# Patient Record
Sex: Male | Born: 1955 | Race: White | Hispanic: No | Marital: Single | Smoking: Former smoker
Health system: Southern US, Community
[De-identification: ages and names within clinical notes are randomized; demographics above are authoritative.]

## PROBLEM LIST (undated history)

## (undated) DIAGNOSIS — M109 Gout, unspecified: Secondary | ICD-10-CM

## (undated) DIAGNOSIS — I10 Essential (primary) hypertension: Secondary | ICD-10-CM

## (undated) DIAGNOSIS — E669 Obesity, unspecified: Secondary | ICD-10-CM

## (undated) DIAGNOSIS — Z8619 Personal history of other infectious and parasitic diseases: Secondary | ICD-10-CM

## (undated) DIAGNOSIS — E785 Hyperlipidemia, unspecified: Secondary | ICD-10-CM

## (undated) DIAGNOSIS — K649 Unspecified hemorrhoids: Secondary | ICD-10-CM

## (undated) HISTORY — DX: Gout, unspecified: M10.9

## (undated) HISTORY — DX: Obesity, unspecified: E66.9

## (undated) HISTORY — DX: Hyperlipidemia, unspecified: E78.5

## (undated) HISTORY — DX: Personal history of other infectious and parasitic diseases: Z86.19

## (undated) HISTORY — DX: Essential (primary) hypertension: I10

## (undated) HISTORY — DX: Unspecified hemorrhoids: K64.9

---
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
## 2011-04-22 HISTORY — PX: UMBILICAL HERNIA REPAIR: SHX196

## 2012-04-09 ENCOUNTER — Ambulatory Visit: Payer: Self-pay | Admitting: Surgery

## 2013-03-31 ENCOUNTER — Encounter (INDEPENDENT_AMBULATORY_CARE_PROVIDER_SITE_OTHER): Payer: Self-pay

## 2013-03-31 ENCOUNTER — Ambulatory Visit (INDEPENDENT_AMBULATORY_CARE_PROVIDER_SITE_OTHER): Payer: 59 | Admitting: Family Medicine

## 2013-03-31 ENCOUNTER — Encounter: Payer: Self-pay | Admitting: Family Medicine

## 2013-03-31 VITALS — BP 124/82 | HR 76 | Temp 97.9°F | Ht 68.0 in | Wt 217.5 lb

## 2013-03-31 DIAGNOSIS — Z0001 Encounter for general adult medical examination with abnormal findings: Secondary | ICD-10-CM | POA: Insufficient documentation

## 2013-03-31 DIAGNOSIS — Z1211 Encounter for screening for malignant neoplasm of colon: Secondary | ICD-10-CM

## 2013-03-31 DIAGNOSIS — Z0000 Encounter for general adult medical examination without abnormal findings: Secondary | ICD-10-CM | POA: Insufficient documentation

## 2013-03-31 DIAGNOSIS — Z125 Encounter for screening for malignant neoplasm of prostate: Secondary | ICD-10-CM

## 2013-03-31 LAB — PSA: PSA: 0.91 ng/mL (ref 0.10–4.00)

## 2013-03-31 LAB — BASIC METABOLIC PANEL
BUN: 14 mg/dL (ref 6–23)
CO2: 29 mEq/L (ref 19–32)
Creatinine, Ser: 1 mg/dL (ref 0.4–1.5)
GFR: 78.81 mL/min (ref >60.00)
Glucose, Bld: 92 mg/dL (ref 70–99)
Potassium: 5.4 mEq/L — ABNORMAL HIGH (ref 3.5–5.1)
Sodium: 140 mEq/L (ref 135–145)

## 2013-03-31 LAB — LIPID PANEL
Cholesterol: 216 mg/dL — ABNORMAL HIGH (ref 0–200)
HDL: 40.6 mg/dL (ref >39.00)
VLDL: 30 mg/dL (ref 0.0–40.0)

## 2013-03-31 LAB — LDL CHOLESTEROL, DIRECT: Direct LDL: 150.6 mg/dL

## 2013-03-31 NOTE — Patient Instructions (Signed)
Get flu shot at local pharmacy. Pass by Marion's office to schedule screening colonoscopy Blood work today. Good to meet you today, call us with questions. Double check on Tdap shot.

## 2013-03-31 NOTE — Assessment & Plan Note (Signed)
Preventative protocols reviewed and updated unless pt declined. Discussed healthy diet and lifestyle.  

## 2013-03-31 NOTE — Progress Notes (Signed)
Subjective:    Patient ID: Randy Decker, male    DOB: 1955-11-01, 57 y.o.   MRN: [id number]  HPI CC: new pt to establish, CPE  Presents for insurance physical. Does notice some soreness in legs in the morning.  Lives alone, 2 cats and 1 dog Occupation: maintenance Psychologist, clinical) Edu: Automotive engineer Activity: active at work Diet: some water, fruits/vegetables daily, rare meat  Preventative: Last CPE 2012 Colon cancer screening - discussed, would like  have colonsocopy done Prostate cancer screening - discussed, would like to screen. Flu shot - today Tetanus - 2012 Hep B series 2012  Seat belt use discussed. Sunscreen use discussed.  No sunburns in last year. No changing spots on skin.  Medications and allergies reviewed and updated in chart.  Past histories reviewed and updated if relevant as below. There are no active problems to display for this patient.  Past Medical History  Diagnosis Date  . History of chicken pox   . H/O measles     possible--can't remember   Past Surgical History  Procedure Laterality Date  . Tonsillectomy and adenoidectomy  1964  . Umbilical hernia repair  2013   History  Substance Use Topics  . Smoking status: Former Smoker -- 1.00 packs/day for 5 years    Types: Cigarettes    Start date: 04/21/1978    Quit date: 04/22/1983  . Smokeless tobacco: Never Used  . Alcohol Use: Yes     Comment: Occasional   Family History  Problem Relation Age of Onset  . Hypertension Father   . Aortic aneurysm Father 22    deceased  . Diabetes Neg Hx   . Cancer Neg Hx   . Stroke Neg Hx    No Known Allergies No current outpatient prescriptions on file prior to visit.   No current facility-administered medications on file prior to visit.     Review of Systems  Constitutional: Negative for fever, chills, activity change, appetite change, fatigue and unexpected weight change.  HENT: Negative for hearing loss.   Eyes: Negative for visual  disturbance.  Respiratory: Negative for cough, chest tightness, shortness of breath and wheezing.   Cardiovascular: Negative for chest pain, palpitations and leg swelling.  Gastrointestinal: Negative for nausea, vomiting, abdominal pain, diarrhea, constipation, blood in stool and abdominal distention.  Genitourinary: Negative for hematuria and difficulty urinating.  Musculoskeletal: Negative for arthralgias, myalgias and neck pain.  Skin: Negative for rash.  Neurological: Negative for dizziness, seizures, syncope and headaches.  Hematological: Negative for adenopathy. Does not bruise/bleed easily.  Psychiatric/Behavioral: Negative for dysphoric mood. The patient is not nervous/anxious.        Objective:   Physical Exam  Nursing note and vitals reviewed. Constitutional: He is oriented to person, place, and time. He appears well-developed and well-nourished. No distress.  HENT:  Head: Normocephalic and atraumatic.  Mouth/Throat: Oropharynx is clear and moist. No oropharyngeal exudate.  Eyes: Conjunctivae and EOM are normal. Pupils are equal, round, and reactive to light. No scleral icterus.  Neck: Normal range of motion. Neck supple. No thyromegaly present.  Cardiovascular: Normal rate, regular rhythm, normal heart sounds and intact distal pulses.   No murmur heard. Pulses:      Radial pulses are 2+ on the right side, and 2+ on the left side.  Pulmonary/Chest: Effort normal and breath sounds normal. No respiratory distress. He has no wheezes. He has no rales.  Abdominal: Soft. Bowel sounds are normal. He exhibits no distension and no mass. There is no  tenderness. There is no rebound and no guarding.  No abd /renal bruits  Genitourinary: Prostate normal. Rectal exam shows external hemorrhoid (noninflamed). Rectal exam shows no internal hemorrhoid, no fissure, no mass, no tenderness and anal tone normal. Prostate is not enlarged (15gm) and not tender.  Musculoskeletal: Normal range of  motion. He exhibits no edema.  Lymphadenopathy:    He has no cervical adenopathy.  Neurological: He is alert and oriented to person, place, and time.  CN grossly intact, station and gait intact  Skin: Skin is warm and dry. No rash noted.  Psychiatric: He has a normal mood and affect. His behavior is normal. Judgment and thought content normal.       Assessment & Plan:

## 2013-03-31 NOTE — Progress Notes (Signed)
Pre-visit discussion using our clinic review tool. No additional management support is needed unless otherwise documented below in the visit note.  

## 2013-04-02 ENCOUNTER — Other Ambulatory Visit: Payer: Self-pay | Admitting: Family Medicine

## 2013-04-02 DIAGNOSIS — E875 Hyperkalemia: Secondary | ICD-10-CM

## 2013-04-07 ENCOUNTER — Encounter: Payer: Self-pay | Admitting: Internal Medicine

## 2013-04-21 HISTORY — PX: COLONOSCOPY: SHX174

## 2013-06-03 ENCOUNTER — Ambulatory Visit (AMBULATORY_SURGERY_CENTER): Payer: 59

## 2013-06-03 VITALS — Ht 68.0 in | Wt 200.0 lb

## 2013-06-03 DIAGNOSIS — Z1211 Encounter for screening for malignant neoplasm of colon: Secondary | ICD-10-CM

## 2013-06-03 MED ORDER — SUPREP BOWEL PREP KIT 17.5-3.13-1.6 GM/177ML PO SOLN: 1.0000 | Freq: Once | ORAL | Status: DC

## 2013-06-07 ENCOUNTER — Encounter: Payer: Self-pay | Admitting: Internal Medicine

## 2013-06-17 ENCOUNTER — Encounter: Payer: 59 | Admitting: Internal Medicine

## 2013-08-09 ENCOUNTER — Encounter: Payer: Self-pay | Admitting: Internal Medicine

## 2013-08-09 ENCOUNTER — Ambulatory Visit (AMBULATORY_SURGERY_CENTER): Payer: 59 | Admitting: Internal Medicine

## 2013-08-09 VITALS — BP 117/88 | HR 55 | Temp 98.7°F | Resp 15 | Ht 68.0 in | Wt 200.0 lb

## 2013-08-09 DIAGNOSIS — Z1211 Encounter for screening for malignant neoplasm of colon: Secondary | ICD-10-CM

## 2013-08-09 MED ORDER — SODIUM CHLORIDE 0.9 % IV SOLN: 500.0000 mL | INTRAVENOUS | Status: DC

## 2013-08-09 NOTE — Patient Instructions (Addendum)
Your colonoscopy was normal - no polyps or cancer seen.  Next routine colonoscopy in 10 years - 2025  I appreciate the opportunity to care for you. Gatha Mayer, MD, FACG   YOU HAD AN ENDOSCOPIC PROCEDURE TODAY AT [facility]: Refer to the procedure report that was given to you for any specific questions about what was found during the examination.  If the procedure report does not answer your questions, please call your gastroenterologist to clarify.  If you requested that your care partner not be given the details of your procedure findings, then the procedure report has been included in a sealed envelope for you to review at your convenience later.  YOU SHOULD EXPECT: Some feelings of bloating in the abdomen. Passage of more gas than usual.  Walking can help get rid of the air that was put into your GI tract during the procedure and reduce the bloating. If you had a lower endoscopy (such as a colonoscopy or flexible sigmoidoscopy) you may notice spotting of blood in your stool or on the toilet paper. If you underwent a bowel prep for your procedure, then you may not have a normal bowel movement for a few days.  DIET: Your first meal following the procedure should be a light meal and then it is ok to progress to your normal diet.  A half-sandwich or bowl of soup is an example of a good first meal.  Heavy or fried foods are harder to digest and may make you feel nauseous or bloated.  Likewise meals heavy in dairy and vegetables can cause extra gas to form and this can also increase the bloating.  Drink plenty of fluids but you should avoid alcoholic beverages for 24 hours.  ACTIVITY: Your care partner should take you home directly after the procedure.  You should plan to take it easy, moving slowly for the rest of the day.  You can resume normal activity the day after the procedure however you should NOT DRIVE or use heavy machinery for 24 hours (because of the sedation medicines  used during the test).    SYMPTOMS TO REPORT IMMEDIATELY: A gastroenterologist can be reached at any hour.  During normal business hours, 8:30 AM to 5:00 PM Monday through Friday, call [phone].  After hours and on weekends, please call the GI answering service at [phone] who will take a message and have the physician on call contact you.   Following lower endoscopy (colonoscopy or flexible sigmoidoscopy):  Excessive amounts of blood in the stool  Significant tenderness or worsening of abdominal pains  Swelling of the abdomen that is new, acute  Fever of 100F or higher  FOLLOW UP: If any biopsies were taken you will be contacted by phone or by letter within the next 1-3 weeks.  Call your gastroenterologist if you have not heard about the biopsies in 3 weeks.  Our staff will call the home number listed on your records the next business day following your procedure to check on you and address any questions or concerns that you may have at that time regarding the information given to you following your procedure. This is a courtesy call and so if there is no answer at the home number and we have not heard from you through the emergency physician on call, we will assume that you have returned to your regular daily activities without incident.  SIGNATURES/CONFIDENTIALITY: You and/or your care partner have signed paperwork which will be entered into  your electronic medical record.  These signatures attest to the fact that that the information above on your After Visit Summary has been reviewed and is understood.  Full responsibility of the confidentiality of this discharge information lies with you and/or your care-partner.

## 2013-08-09 NOTE — Op Note (Signed)
[facility]  [address] [place], [address]   COLONOSCOPY PROCEDURE REPORT  PATIENT: Randy Decker, Randy Decker  MR#: [id number] BIRTHDATE: 1956-01-24 , 72  yrs. old GENDER: Male ENDOSCOPIST: Gatha Mayer, MD, [facility] REFERRED NU:[id number] Danise Mina, M.D. PROCEDURE DATE:  08/09/2013 PROCEDURE:   Colonoscopy, screening First Screening Colonoscopy - Avg.  risk and is 50 yrs.  old or older Yes.  Prior Negative Screening - Now for repeat screening. N/A  History of Adenoma - Now for follow-up colonoscopy & has been > or = to 3 yrs.  N/A  Polyps Removed Today? No.  Recommend repeat exam, <10 yrs? No. ASA CLASS:   Class II INDICATIONS:average risk screening and first colonoscopy. MEDICATIONS: propofol (Diprivan) 250mg  IV, MAC sedation, administered by CRNA, and These medications were titrated to patient response per physician's verbal order  DESCRIPTION OF PROCEDURE:   After the risks benefits and alternatives of the procedure were thoroughly explained, informed consent was obtained.  A digital rectal exam revealed no abnormalities of the rectum, A digital rectal exam revealed no prostatic nodules, and A digital rectal exam revealed the prostate was not enlarged.   The LB [id number]  endoscope was introduced through the anus and advanced to the cecum, which was identified by both the appendix and ileocecal valve. No adverse events experienced.   The quality of the prep was excellent using Suprep  The instrument was then slowly withdrawn as the colon was fully examined.      COLON FINDINGS: A normal appearing cecum, ileocecal valve, and appendiceal orifice were identified.  The ascending, hepatic flexure, transverse, splenic flexure, descending, sigmoid colon and rectum appeared unremarkable.  No polyps or cancers were seen.   A right colon retroflexion was performed.  Retroflexed views revealed no abnormalities. The time to cecum=3 minutes 42 seconds. Withdrawal time=8  minutes 38 seconds.  The scope was withdrawn and the procedure completed. COMPLICATIONS: There were no complications.  ENDOSCOPIC IMPRESSION: Normal colonoscopy - excellent prep  RECOMMENDATIONS: Repeat Colonscopy in 10 years.   eSigned:  Gatha Mayer, MD, [facility] 08/09/2013 11:03 AM   cc: Ria Bush MD and The Patient

## 2013-08-09 NOTE — Progress Notes (Signed)
Lidocaine-40mg IV prior to Propofol InductionPropofol given over incremental dosages 

## 2013-08-10 ENCOUNTER — Telehealth: Payer: Self-pay

## 2013-08-10 NOTE — Telephone Encounter (Signed)
  Follow up Call-  Call back number 08/09/2013  Post procedure Call Back phone  # [phone]  Permission to leave phone message Yes     Patient questions:  Do you have a fever, pain , or abdominal swelling? no Pain Score  0 *  Have you tolerated food without any problems? yes  Have you been able to return to your normal activities? yes  Do you have any questions about your discharge instructions: Diet   no Medications  no Follow up visit  no  Do you have questions or concerns about your Care? no  Actions: * If pain score is 4 or above: No action needed, pain <4.  No problems per the pt. "Randy Server do a wonderful job up there and I can't thank you enough" per the pt. maw

## 2013-08-12 ENCOUNTER — Encounter: Payer: Self-pay | Admitting: Family Medicine

## 2014-05-22 ENCOUNTER — Telehealth: Payer: Self-pay | Admitting: *Deleted

## 2014-05-22 ENCOUNTER — Ambulatory Visit (INDEPENDENT_AMBULATORY_CARE_PROVIDER_SITE_OTHER): Payer: 59 | Admitting: Family Medicine

## 2014-05-22 ENCOUNTER — Encounter: Payer: Self-pay | Admitting: Family Medicine

## 2014-05-22 VITALS — BP 120/80 | HR 64 | Temp 98.1°F | Wt 222.0 lb

## 2014-05-22 DIAGNOSIS — M79671 Pain in right foot: Secondary | ICD-10-CM

## 2014-05-22 LAB — CBC WITH DIFFERENTIAL/PLATELET
BASOS ABS: 0.1 10*3/uL (ref 0.0–0.1)
Basophils Relative: 0.7 % (ref 0.0–3.0)
Eosinophils Absolute: 0.1 10*3/uL (ref 0.0–0.7)
Eosinophils Relative: 0.7 % (ref 0.0–5.0)
HEMATOCRIT: 40.5 % (ref 39.0–52.0)
Hemoglobin: 13.8 g/dL (ref 13.0–17.0)
LYMPHS PCT: 24.3 % (ref 12.0–46.0)
Lymphs Abs: 2.1 10*3/uL (ref 0.7–4.0)
MCHC: 34.1 g/dL (ref 30.0–36.0)
MCV: 88.6 fl (ref 78.0–100.0)
MONO ABS: 0.9 10*3/uL (ref 0.1–1.0)
Monocytes Relative: 10.6 % (ref 3.0–12.0)
NEUTROS PCT: 63.7 % (ref 43.0–77.0)
Neutro Abs: 5.6 10*3/uL (ref 1.4–7.7)
Platelets: 230 10*3/uL (ref 150.0–400.0)
RBC: 4.57 Mil/uL (ref 4.22–5.81)
RDW: 12.4 % (ref 11.5–15.5)
WBC: 8.7 10*3/uL (ref 4.0–10.5)

## 2014-05-22 LAB — BASIC METABOLIC PANEL
BUN: 14 mg/dL (ref 6–23)
CO2: 29 mEq/L (ref 19–32)
Calcium: 8.7 mg/dL (ref 8.4–10.5)
Chloride: 105 mEq/L (ref 96–112)
Creatinine, Ser: 0.92 mg/dL (ref 0.40–1.50)
GFR: 89.43 mL/min (ref >60.00)
Glucose, Bld: 91 mg/dL (ref 70–99)
Potassium: 4.2 mEq/L (ref 3.5–5.1)
Sodium: 140 mEq/L (ref 135–145)

## 2014-05-22 LAB — URIC ACID: Uric Acid, Serum: 6.6 mg/dL (ref 4.0–7.8)

## 2014-05-22 LAB — HIGH SENSITIVITY CRP: CRP, High Sensitivity: 73.63 mg/L — ABNORMAL HIGH (ref 0.000–5.000)

## 2014-05-22 MED ORDER — COLCHICINE 0.6 MG PO TABS: ORAL_TABLET | ORAL | Status: DC

## 2014-05-22 NOTE — Telephone Encounter (Signed)
plz start prior auth.

## 2014-05-22 NOTE — Progress Notes (Signed)
Pre visit review using our clinic review tool, if applicable. No additional management support is needed unless otherwise documented below in the visit note. 

## 2014-05-22 NOTE — Patient Instructions (Addendum)
I wonder about gout - start colchicine 2 tablets today then one tablet daily until pain relieved. May also continue ibuprofen 600mg  twice to three times daily with food.  Blood work today to check for gout vs infection  Gout Gout is an inflammatory arthritis caused by a buildup of uric acid crystals in the joints. Uric acid is a chemical that is normally present in the blood. When the level of uric acid in the blood is too high it can form crystals that deposit in your joints and tissues. This causes joint redness, soreness, and swelling (inflammation). Repeat attacks are common. Over time, uric acid crystals can form into masses (tophi) near a joint, destroying bone and causing disfigurement. Gout is treatable and often preventable. CAUSES  The disease begins with elevated levels of uric acid in the blood. Uric acid is produced by your body when it breaks down a naturally found substance called purines. Certain foods you eat, such as meats and fish, contain high amounts of purines. Causes of an elevated uric acid level include:  Being passed down from parent to child (heredity).  Diseases that cause increased uric acid production (such as obesity, psoriasis, and certain cancers).  Excessive alcohol use.  Diet, especially diets rich in meat and seafood.  Medicines, including certain cancer-fighting medicines (chemotherapy), water pills (diuretics), and aspirin.  Chronic kidney disease. The kidneys are no longer able to remove uric acid well.  Problems with metabolism. Conditions strongly associated with gout include:  Obesity.  High blood pressure.  High cholesterol.  Diabetes. Not everyone with elevated uric acid levels gets gout. It is not understood why some people get gout and others do not. Surgery, joint injury, and eating too much of certain foods are some of the factors that can lead to gout attacks. SYMPTOMS   An attack of gout comes on quickly. It causes intense pain with  redness, swelling, and warmth in a joint.  Fever can occur.  Often, only one joint is involved. Certain joints are more commonly involved:  Base of the big toe.  Knee.  Ankle.  Wrist.  Finger. Without treatment, an attack usually goes away in a few days to weeks. Between attacks, you usually will not have symptoms, which is different from many other forms of arthritis. DIAGNOSIS  Your caregiver will suspect gout based on your symptoms and exam. In some cases, tests may be recommended. The tests may include:  Blood tests.  Urine tests.  X-rays.  Joint fluid exam. This exam requires a needle to remove fluid from the joint (arthrocentesis). Using a microscope, gout is confirmed when uric acid crystals are seen in the joint fluid. TREATMENT  There are two phases to gout treatment: treating the sudden onset (acute) attack and preventing attacks (prophylaxis).  Treatment of an Acute Attack.  Medicines are used. These include anti-inflammatory medicines or steroid medicines.  An injection of steroid medicine into the affected joint is sometimes necessary.  The painful joint is rested. Movement can worsen the arthritis.  You may use warm or cold treatments on painful joints, depending which works best for you.  Treatment to Prevent Attacks.  If you suffer from frequent gout attacks, your caregiver may advise preventive medicine. These medicines are started after the acute attack subsides. These medicines either help your kidneys eliminate uric acid from your body or decrease your uric acid production. You may need to stay on these medicines for a very long time.  The early phase of treatment with  preventive medicine can be associated with an increase in acute gout attacks. For this reason, during the first few months of treatment, your caregiver may also advise you to take medicines usually used for acute gout treatment. Be sure you understand your caregiver's directions. Your  caregiver may make several adjustments to your medicine dose before these medicines are effective.  Discuss dietary treatment with your caregiver or dietitian. Alcohol and drinks high in sugar and fructose and foods such as meat, poultry, and seafood can increase uric acid levels. Your caregiver or dietitian can advise you on drinks and foods that should be limited. HOME CARE INSTRUCTIONS   Do not take aspirin to relieve pain. This raises uric acid levels.  Only take over-the-counter or prescription medicines for pain, discomfort, or fever as directed by your caregiver.  Rest the joint as much as possible. When in bed, keep sheets and blankets off painful areas.  Keep the affected joint raised (elevated).  Apply warm or cold treatments to painful joints. Use of warm or cold treatments depends on which works best for you.  Use crutches if the painful joint is in your leg.  Drink enough fluids to keep your urine clear or pale yellow. This helps your body get rid of uric acid. Limit alcohol, sugary drinks, and fructose drinks.  Follow your dietary instructions. Pay careful attention to the amount of protein you eat. Your daily diet should emphasize fruits, vegetables, whole grains, and fat-free or low-fat milk products. Discuss the use of coffee, vitamin C, and cherries with your caregiver or dietitian. These may be helpful in lowering uric acid levels.  Maintain a healthy body weight. SEEK MEDICAL CARE IF:   You develop diarrhea, vomiting, or any side effects from medicines.  You do not feel better in 24 hours, or you are getting worse. SEEK IMMEDIATE MEDICAL CARE IF:   Your joint becomes suddenly more tender, and you have chills or a fever. MAKE SURE YOU:   Understand these instructions.  Will watch your condition.  Will get help right away if you are not doing well or get worse. Document Released: 04/04/2000 Document Revised: 08/22/2013 Document Reviewed: 11/19/2011 [facility]  Patient Information 2015 [place], [place]. This information is not intended to replace advice given to you by your health care provider. Make sure you discuss any questions you have with your health care provider.

## 2014-05-22 NOTE — Telephone Encounter (Signed)
Patient called stating that the pharmacy told him that the insurance company needs more information before they can fill the Colchicine. Spoke to [place] at [facility] and was advised that [place] [employer] is requiring a PA. Telephone number [phone] card #[id number]

## 2014-05-22 NOTE — Assessment & Plan Note (Signed)
Anticipate acute gouty flare given description of previous episodes as well as current erythema/swelling. Less likely R peroneal tendonitis. Not consistent with infection/septic joint given preserved motion at ankle joint. Treat with ibuprofen 600mg  BID to TID with meals PRN and colchicine 1.2mg  on day one and then 0.6 mg daily prn pain. Check labs today - CRP, CBC, Cr, and uric acid. Discussed gout flare treatment, possible chronic gout preventative treatments, and provided with gout pt education handout. Pt agrees with plan.

## 2014-05-22 NOTE — Progress Notes (Signed)
   BP 120/80 mmHg  Pulse 64  Temp(Src) 98.1 F (36.7 C) (Oral)  Wt 222 lb (100.699 kg)   CC: R foot pain  Subjective:    Patient ID: Randy Decker, male    DOB: [birth year], 59 y.o.   MRN: [id number]  HPI: DEMETRIE Decker is a 59 y.o. male presenting on 05/22/2014 for Foot Pain   Last seen here 03/2013.  3d h/o R foot pain/swelling. Started lateral R ankle, pain and swelling progressively worsening. Denies inciting trauma/injury to foot. Denies fevers/chills. Tends to get flare of foot pains about once a year.  Has tried ibuprofen 2-3 tablets as needed which has helped some.  No known h/o gout but describes h/o bilateral podagra. Drinks a few times a month. Recently drank 8 beers 1 wk ago.   Weight gain noted - 22 lbs.   Lab Results  Component Value Date   CREATININE 1.0 03/31/2013    Relevant past medical, surgical, family and social history reviewed and updated as indicated. Interim medical history since our last visit reviewed. Allergies and medications reviewed and updated. Current Outpatient Prescriptions on File Prior to Visit  Medication Sig  . aspirin 81 MG tablet Take 81 mg by mouth daily.  . vitamin B-12 (CYANOCOBALAMIN) 500 MCG tablet Take 500 mcg by mouth daily.   No current facility-administered medications on file prior to visit.    Review of Systems Per HPI unless specifically indicated above     Objective:    BP 120/80 mmHg  Pulse 64  Temp(Src) 98.1 F (36.7 C) (Oral)  Wt 222 lb (100.699 kg)  Wt Readings from Last 3 Encounters:  05/22/14 222 lb (100.699 kg)  08/09/13 200 lb (90.719 kg)  06/03/13 200 lb (90.719 kg)    Physical Exam  Constitutional: He appears well-developed and well-nourished. No distress.  Musculoskeletal: He exhibits edema.  R foot erythema/swelling with main pain centered around lateral ankle posterior and inferior to malleolus. Mild discomfort at midfoot as well. No pain or ligament laxity with testing. No pain at 1st  MTP. 2+ DP bilaterally  Skin: Skin is warm and dry. No rash noted. There is erythema.  Nursing note and vitals reviewed.      Assessment & Plan:   Problem List Items Addressed This Visit    Right foot pain - Primary    Anticipate acute gouty flare given description of previous episodes as well as current erythema/swelling. Less likely R peroneal tendonitis. Not consistent with infection/septic joint given preserved motion at ankle joint. Treat with ibuprofen 600mg  BID to TID with meals PRN and colchicine 1.2mg  on day one and then 0.6 mg daily prn pain. Check labs today - CRP, CBC, Cr, and uric acid. Discussed gout flare treatment, possible chronic gout preventative treatments, and provided with gout pt education handout. Pt agrees with plan.      Relevant Orders   Basic metabolic panel   CBC with Differential/Platelet   Uric acid   High sensitivity CRP       Follow up plan: Return if symptoms worsen or fail to improve.

## 2014-05-24 NOTE — Telephone Encounter (Signed)
In your IN box for completion.  

## 2014-05-25 NOTE — Telephone Encounter (Signed)
Filled and in Kim's box. 

## 2014-05-25 NOTE — Telephone Encounter (Signed)
Form faxed. Will await determination. 

## 2014-05-26 NOTE — Telephone Encounter (Signed)
Colchicine denied. Letter in your IN box for review.

## 2014-05-28 MED ORDER — COLCHICINE 0.6 MG PO TABS: ORAL_TABLET | ORAL | Status: DC

## 2014-05-28 NOTE — Telephone Encounter (Addendum)
Sent in [place] or equivalent to pharmacy. Spoke with [place] which is colchicine capsule - goes through but needs to be ordered by name and is $35. Apparently new formulary for [place]. plz call and notify pt colchicine is available. Placed denial in Kim's box.

## 2014-05-28 NOTE — Addendum Note (Signed)
Addended by: Ria Bush on: 05/28/2014 12:23 PM   Modules accepted: Orders

## 2014-05-29 NOTE — Telephone Encounter (Signed)
Message left notifying patient.

## 2014-06-15 ENCOUNTER — Encounter: Payer: Self-pay | Admitting: Family Medicine

## 2014-06-15 ENCOUNTER — Ambulatory Visit (INDEPENDENT_AMBULATORY_CARE_PROVIDER_SITE_OTHER): Payer: 59 | Admitting: Family Medicine

## 2014-06-15 VITALS — BP 122/86 | HR 73 | Temp 97.7°F | Ht 68.0 in | Wt 221.5 lb

## 2014-06-15 DIAGNOSIS — IMO0001 Reserved for inherently not codable concepts without codable children: Secondary | ICD-10-CM

## 2014-06-15 DIAGNOSIS — K644 Residual hemorrhoidal skin tags: Secondary | ICD-10-CM | POA: Insufficient documentation

## 2014-06-15 DIAGNOSIS — Z125 Encounter for screening for malignant neoplasm of prostate: Secondary | ICD-10-CM

## 2014-06-15 DIAGNOSIS — Z0000 Encounter for general adult medical examination without abnormal findings: Secondary | ICD-10-CM

## 2014-06-15 DIAGNOSIS — Z8249 Family history of ischemic heart disease and other diseases of the circulatory system: Secondary | ICD-10-CM | POA: Insufficient documentation

## 2014-06-15 NOTE — Assessment & Plan Note (Signed)
Preventative protocols reviewed and updated unless pt declined. Discussed healthy diet and lifestyle.  

## 2014-06-15 NOTE — Assessment & Plan Note (Signed)
Not currently bothersome.

## 2014-06-15 NOTE — Patient Instructions (Addendum)
Blood work today. Good to see you today, call us with quesitons. Return as needed or in 1 year for follow up If itching issue , may use hydrocortisone 10 OTC for a few days at a time.

## 2014-06-15 NOTE — Progress Notes (Signed)
BP 122/86 mmHg  Pulse 73  Temp(Src) 97.7 F (36.5 C) (Oral)  Ht 5\' 8"  (1.727 m)  Wt 221 lb 8 oz (100.472 kg)  BMI 33.69 kg/m2  SpO2 97%   CC: CPE  Subjective:    Patient ID: Randy Decker, male    DOB: Dec 09, 1955, 59 y.o.   MRN: [id number]  HPI: Randy Decker is a 59 y.o. male presenting on 06/15/2014 for Annual Exam   Recent gout clare improved mitigare but took a while to be approved.  fmhx AAA (father) - ex smoker  Preventative: COLONOSCOPY Date: 2015 WNL Carlean Purl) Prostate cancer screening - discussed, would like to screen. Flu shot - 02/2014 Tetanus - 2012 Hep B series 2012 Seat belt use discussed. Sunscreen use discussed. No sunburns in last year. No changing spots on skin.   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 Interim medical history since our last visit reviewed. Allergies and medications reviewed and updated. Current Outpatient Prescriptions on File Prior to Visit  Medication Sig  . aspirin 81 MG tablet Take 81 mg by mouth daily.  . vitamin B-12 (CYANOCOBALAMIN) 500 MCG tablet Take 500 mcg by mouth daily.   No current facility-administered medications on file prior to visit.    Review of Systems  Constitutional: Negative for fever, chills, activity change, appetite change, fatigue and unexpected weight change.  HENT: Negative for hearing loss.   Eyes: Negative for visual disturbance.  Respiratory: Negative for cough, chest tightness, shortness of breath and wheezing.   Cardiovascular: Negative for chest pain, palpitations and leg swelling.  Gastrointestinal: Negative for nausea, vomiting, abdominal pain, diarrhea, constipation, blood in stool and abdominal distention.  Genitourinary: Negative for hematuria and difficulty  urinating.  Musculoskeletal: Positive for joint swelling. Negative for myalgias (?gout), arthralgias and neck pain.  Skin: Negative for rash.  Neurological: Negative for dizziness, seizures, syncope and headaches.  Hematological: Negative for adenopathy. Does not bruise/bleed easily.  Psychiatric/Behavioral: Negative for dysphoric mood. The patient is not nervous/anxious.    Per HPI unless specifically indicated above     Objective:    BP 122/86 mmHg  Pulse 73  Temp(Src) 97.7 F (36.5 C) (Oral)  Ht 5\' 8"  (1.727 m)  Wt 221 lb 8 oz (100.472 kg)  BMI 33.69 kg/m2  SpO2 97%  Wt Readings from Last 3 Encounters:  06/15/14 221 lb 8 oz (100.472 kg)  05/22/14 222 lb (100.699 kg)  08/09/13 200 lb (90.719 kg)    Physical Exam  Constitutional: He is oriented to person, place, and time. He appears well-developed and well-nourished. No distress.  HENT:  Head: Normocephalic and atraumatic.  Right Ear: Hearing, tympanic membrane, external ear and ear canal normal.  Left Ear: Hearing, tympanic membrane, external ear and ear canal normal.  Nose: Nose normal.  Mouth/Throat: Uvula is midline, oropharynx is clear and moist and mucous membranes are normal. No oropharyngeal exudate, posterior oropharyngeal edema or posterior oropharyngeal erythema.  Eyes: Conjunctivae and EOM are normal. Pupils are equal, round, and reactive to light. No scleral icterus.  Neck: Normal range of motion. Neck supple. No thyromegaly present.  Cardiovascular: Normal rate, regular rhythm, normal heart sounds and intact distal pulses.   No murmur heard. Pulses:      Radial pulses are 2+ on the right side, and 2+ on the  left side.  Pulmonary/Chest: Effort normal and breath sounds normal. No respiratory distress. He has no wheezes. He has no rales.  Abdominal: Soft. Bowel sounds are normal. He exhibits no distension and no mass. There is no tenderness. There is no rebound and no guarding.  Genitourinary: Prostate normal.  Rectal exam shows external hemorrhoid (noninflamed). Rectal exam shows no internal hemorrhoid, no fissure, no mass, no tenderness and anal tone normal. Prostate is not enlarged (15-20gm) and not tender.  Musculoskeletal: Normal range of motion. He exhibits no edema.  Lymphadenopathy:    He has no cervical adenopathy.  Neurological: He is alert and oriented to person, place, and time.  CN grossly intact, station and gait intact  Skin: Skin is warm and dry. No rash noted.  Psychiatric: He has a normal mood and affect. His behavior is normal. Judgment and thought content normal.  Nursing note and vitals reviewed.  Results for orders placed or performed in visit on 78/67/67  Basic metabolic panel  Result Value Ref Range   Sodium 140 135 - 145 mEq/L   Potassium 4.2 3.5 - 5.1 mEq/L   Chloride 105 96 - 112 mEq/L   CO2 29 19 - 32 mEq/L   Glucose, Bld 91 70 - 99 mg/dL   BUN 14 6 - 23 mg/dL   Creatinine, Ser 0.92 0.40 - 1.50 mg/dL   Calcium 8.7 8.4 - 10.5 mg/dL   GFR 89.43 >60.00 mL/min  CBC with Differential/Platelet  Result Value Ref Range   WBC 8.7 4.0 - 10.5 K/uL   RBC 4.57 4.22 - 5.81 Mil/uL   Hemoglobin 13.8 13.0 - 17.0 g/dL   HCT 40.5 39.0 - 52.0 %   MCV 88.6 78.0 - 100.0 fl   MCHC 34.1 30.0 - 36.0 g/dL   RDW 12.4 11.5 - 15.5 %   Platelets 230.0 150.0 - 400.0 K/uL   Neutrophils Relative % 63.7 43.0 - 77.0 %   Lymphocytes Relative 24.3 12.0 - 46.0 %   Monocytes Relative 10.6 3.0 - 12.0 %   Eosinophils Relative 0.7 0.0 - 5.0 %   Basophils Relative 0.7 0.0 - 3.0 %   Neutro Abs 5.6 1.4 - 7.7 K/uL   Lymphs Abs 2.1 0.7 - 4.0 K/uL   Monocytes Absolute 0.9 0.1 - 1.0 K/uL   Eosinophils Absolute 0.1 0.0 - 0.7 K/uL   Basophils Absolute 0.1 0.0 - 0.1 K/uL  Uric acid  Result Value Ref Range   Uric Acid, Serum 6.6 4.0 - 7.8 mg/dL  High sensitivity CRP  Result Value Ref Range   CRP, High Sensitivity 73.630 (H) 0.000 - 5.000 mg/L      Assessment & Plan:   Problem List Items Addressed  This Visit    Healthcare maintenance - Primary    Preventative protocols reviewed and updated unless pt declined. Discussed healthy diet and lifestyle.       Relevant Orders   Lipid panel   Hepatic Function Panel   External hemorrhoids    Not currently bothersome.      AAA family hx    Consider screening US age 89 given father with AAA death       Other Visit Diagnoses    Special screening for malignant neoplasm of prostate        Relevant Orders    PSA        Follow up plan: Return as needed, for annual exam, prior fasting for blood work.

## 2014-06-15 NOTE — Progress Notes (Signed)
Pre visit review using our clinic review tool, if applicable. No additional management support is needed unless otherwise documented below in the visit note. 

## 2014-06-15 NOTE — Assessment & Plan Note (Signed)
Consider screening US age 59 given father with AAA death

## 2014-06-16 LAB — HEPATIC FUNCTION PANEL
ALT: 15 U/L (ref 0–53)
AST: 16 U/L (ref 0–37)
Albumin: 4.4 g/dL (ref 3.5–5.2)
Alkaline Phosphatase: 52 U/L (ref 39–117)
BILIRUBIN TOTAL: 0.5 mg/dL (ref 0.2–1.2)
Bilirubin, Direct: 0.1 mg/dL (ref 0.0–0.3)
Total Protein: 7.5 g/dL (ref 6.0–8.3)

## 2014-06-16 LAB — PSA: PSA: 1 ng/mL (ref 0.10–4.00)

## 2014-06-16 LAB — LIPID PANEL
CHOLESTEROL: 220 mg/dL — AB (ref 0–200)
HDL: 42.2 mg/dL (ref >39.00)
LDL Cholesterol: 160 mg/dL — ABNORMAL HIGH (ref 0–99)
NONHDL: 177.8
TRIGLYCERIDES: 90 mg/dL (ref 0.0–149.0)
Total CHOL/HDL Ratio: 5
VLDL: 18 mg/dL (ref 0.0–40.0)

## 2014-06-17 ENCOUNTER — Encounter: Payer: Self-pay | Admitting: Family Medicine

## 2014-06-17 DIAGNOSIS — E785 Hyperlipidemia, unspecified: Secondary | ICD-10-CM | POA: Insufficient documentation

## 2014-06-19 ENCOUNTER — Encounter: Payer: Self-pay | Admitting: *Deleted

## 2014-08-08 NOTE — Op Note (Signed)
PATIENT NAME:  Randy Decker, Randy Decker MR#:  [id number] DATE OF BIRTH:  [birth year]  DATE OF PROCEDURE:  04/09/2012  PREOPERATIVE DIAGNOSES:  1. Umbilical hernia.  2. Left knee mass.   POSTOPERATIVE DIAGNOSES:  1. Umbilical hernia.  2. Left knee mass.   PROCEDURE: 1. Umbilical herniorrhaphy.  2. Excisional biopsy of the left knee mass.   SURGEON: Suzzanne Brunkhorst E. Burt Knack, M.D.   ANESTHESIA: General with endotracheal tube.   INDICATIONS: This is a patient with an umbilical hernia and a left knee mass. Preoperatively, we discussed rationale for surgery, the options of observation, the risk of bleeding, infection, recurrence, and cosmetic deformity, the potential for mesh placement and mesh infection were all reviewed. He understood and agreed to proceed.   FINDINGS: A small umbilical hernia measuring less than 1 cm closed with figure-of-eight 0 Ethibond.   A left anterior medial knee mass cephalad to the patella in subcutaneous tissue is probably cystic in nature, measuring 1 cm, closed with 3 cm intermediate closure incision.   DESCRIPTION OF PROCEDURE: The patient was induced to general anesthesia, given IV antibiotics, prepped and draped in a sterile fashion. Marcaine was infiltrated in skin and subcutaneous tissues around the periumbilical area. An incision was made. Dissection down to the hernia sac was performed. It was opened and reduced. The fascial edges were cleaned and then figure-of-eight 0 Ethibonds were placed to repair the umbilical hernia once it was determined that mesh would not be necessary in this small hernia.  Additional Marcaine was placed and then the wound was closed by tacking the skin of the umbilicus back to the fascia and placing deep sutures of 3-0 Vicryl followed by 4-0 subcuticular Monocryl. Steri-Strips and Mastisol and a sterile dressing was placed.   Attention was turned to the left knee mass which was previously marked, visible and palpable. Local anesthetic was  infiltrated in the skin and subcutaneous tissues around this and then a transverse lenticular-shaped incision was utilized to open and explore the subcutaneous tissue around this mass. It was elevated with electrocautery and passed off for examination. It is likely a simple cyst.   Once assuring that hemostasis was adequate, additional Marcaine was placed (30 mL was utilized for the entire case). The wound was closed in layers of 3-0 Vicryl followed by 4-0 subcuticular Monocryl. Steri-Strips, Mastisol, and sterile dressings were placed.   The patient tolerated the procedure well. There were no complications. He was taken to the recovery room in stable condition to be discharged to the care of his family and followup in 10 days.  ____________________________ Jerrol Banana Burt Knack, MD rec:jm D: 04/09/2012 13:22:29 ET T: 04/10/2012 16:14:09 ET JOB#: [id number]  cc: Jerrol Banana. Burt Knack, MD, <Dictator> Florene Glen MD ELECTRONICALLY SIGNED 04/11/2012 9:44

## 2014-08-08 NOTE — H&P (Signed)
PATIENT NAME:  Randy Decker, Randy Decker MR#:  [id number] DATE OF BIRTH:  11/20/1955  DATE OF ADMISSION:  04/09/2012  CHIEF COMPLAINT:  Umbilical hernia.   HISTORY OF PRESENT ILLNESS:  This is a patient with a symptomatic umbilical hernia. He is here for elective repair. He also has a left knee mass anteriorly which has been present for 2 years and seems to be growing and causing him some pain. This will be excised as well for biopsy purposes.   PAST MEDICAL HISTORY:  Umbilical hernia and left knee nodule.   PAST SURGICAL HISTORY:  None.   SOCIAL HISTORY:  Occasionally uses alcohol. Does not smoke.   REVIEW OF SYSTEMS:  A 10 system review is recorded in the office chart and negative with the exception of that mentioned in the HPI.   PHYSICAL EXAMINATION: GENERAL: Healthy-appearing Caucasian male patient.  HEENT: No scleral icterus.  NECK: No palpable neck nodes.  CHEST: Clear to auscultation.  CARDIAC: Regular rate and rhythm.  ABDOMEN: Soft, nontender. There is a small umbilical hernia which is reducible.  EXTREMITIES: Without edema. On the left knee anteriorly, there is an 8 mm mass present.  NEUROLOGIC: Grossly intact.  INTEGUMENT: No jaundice.   ASSESSMENT AND PLAN:  This is a patient with an umbilical hernia here for elective repair. Rationale for surgery has been discussed. The options of observation were reviewed and the risks of bleeding, infection, mesh placement, mesh infection, cosmetic deformity and recurrence were reviewed for him.   He also has a left anterior knee mass which is growing and causing him some pain. He is requesting excisional biopsy. The rationale for this has been discussed. The options of observation have been reviewed. The risk of bleeding, infection, recurrence, open wound and cosmetic deformity were reviewed. He understood and agreed to proceed.    ____________________________ Jerrol Banana Burt Knack, MD rec:si D: 04/08/2012 22:58:30 ET T: 04/08/2012 23:38:43  ET JOB#: [id number]  cc: Jerrol Banana. Burt Knack, MD, <Dictator> Florene Glen MD ELECTRONICALLY SIGNED 04/09/2012 18:42

## 2014-09-18 ENCOUNTER — Other Ambulatory Visit: Payer: Self-pay | Admitting: Family Medicine

## 2015-02-27 ENCOUNTER — Encounter: Payer: Self-pay | Admitting: Family Medicine

## 2015-02-27 ENCOUNTER — Ambulatory Visit (INDEPENDENT_AMBULATORY_CARE_PROVIDER_SITE_OTHER): Payer: 59 | Admitting: Family Medicine

## 2015-02-27 VITALS — BP 142/90 | HR 72 | Temp 98.4°F | Wt 225.5 lb

## 2015-02-27 DIAGNOSIS — I10 Essential (primary) hypertension: Secondary | ICD-10-CM | POA: Diagnosis not present

## 2015-02-27 DIAGNOSIS — E669 Obesity, unspecified: Secondary | ICD-10-CM | POA: Diagnosis not present

## 2015-02-27 DIAGNOSIS — R030 Elevated blood-pressure reading, without diagnosis of hypertension: Secondary | ICD-10-CM | POA: Insufficient documentation

## 2015-02-27 DIAGNOSIS — E66811 Obesity, class 1: Secondary | ICD-10-CM

## 2015-02-27 HISTORY — DX: Obesity, class 1: E66.811

## 2015-02-27 HISTORY — DX: Obesity, unspecified: E66.9

## 2015-02-27 HISTORY — DX: Essential (primary) hypertension: I10

## 2015-02-27 MED ORDER — AMLODIPINE BESYLATE 2.5 MG PO TABS: 2.5000 mg | ORAL_TABLET | Freq: Every day | ORAL | Status: DC

## 2015-02-27 NOTE — Assessment & Plan Note (Signed)
Discussed healthy diet and lifestyle changes to affect sustainable weight loss  

## 2015-02-27 NOTE — Progress Notes (Signed)
Pre visit review using our clinic review tool, if applicable. No additional management support is needed unless otherwise documented below in the visit note. 

## 2015-02-27 NOTE — Patient Instructions (Addendum)
Blood pressure staying mildly elevated. Start amlodipine 2.5mg  daily.  Your goal blood pressure is <140/90. Work on low salt/sodium diet - goal <1.5gm (1,500mg ) per day. Eat a diet high in fruits/vegetables and whole grains.  Look into mediterranean and DASH diet. Goal activity is 164min/wk of moderate intensity exercise.  This can be split into 30 minute chunks.  If you are not at this level, you can start with smaller 10-15 min increments and slowly build up activity. Look at [place].org for more resources Return in 4 months for physical  DASH Eating Plan DASH stands for "Dietary Approaches to Stop Hypertension." The DASH eating plan is a healthy eating plan that has been shown to reduce high blood pressure (hypertension). Additional health benefits may include reducing the risk of type 2 diabetes mellitus, heart disease, and stroke. The DASH eating plan may also help with weight loss. WHAT DO I NEED TO KNOW ABOUT THE DASH EATING PLAN? For the DASH eating plan, you will follow these general guidelines:  Choose foods with a percent daily value for sodium of less than 5% (as listed on the food label).  Use salt-free seasonings or herbs instead of table salt or sea salt.  Check with your health care provider or pharmacist before using salt substitutes.  Eat lower-sodium products, often labeled as "lower sodium" or "no salt added."  Eat fresh foods.  Eat more vegetables, fruits, and low-fat dairy products.  Choose whole grains. Look for the word "whole" as the first word in the ingredient list.  Choose fish and skinless chicken or [place] more often than red meat. Limit fish, poultry, and meat to 6 oz (170 g) each day.  Limit sweets, desserts, sugars, and sugary drinks.  Choose heart-healthy fats.  Limit cheese to 1 oz (28 g) per day.  Eat more home-cooked food and less restaurant, buffet, and fast food.  Limit fried foods.  Cook foods using methods other than frying.  Limit  canned vegetables. If you do use them, rinse them well to decrease the sodium.  When eating at a restaurant, ask that your food be prepared with less salt, or no salt if possible. WHAT FOODS CAN I EAT? Seek help from a dietitian for individual calorie needs. Grains Whole grain or whole wheat bread. Brown rice. Whole grain or whole wheat pasta. Quinoa, bulgur, and whole grain cereals. Low-sodium cereals. Corn or whole wheat flour tortillas. Whole grain cornbread. Whole grain crackers. Low-sodium crackers. Vegetables Fresh or frozen vegetables (raw, steamed, roasted, or grilled). Low-sodium or reduced-sodium tomato and vegetable juices. Low-sodium or reduced-sodium tomato sauce and paste. Low-sodium or reduced-sodium canned vegetables.  Fruits All fresh, canned (in natural juice), or frozen fruits. Meat and Other Protein Products Ground beef (85% or leaner), grass-fed beef, or beef trimmed of fat. Skinless chicken or [place]. Ground chicken or [place]. Pork trimmed of fat. All fish and seafood. Eggs. Dried beans, peas, or lentils. Unsalted nuts and seeds. Unsalted canned beans. Dairy Low-fat dairy products, such as skim or 1% milk, 2% or reduced-fat cheeses, low-fat ricotta or cottage cheese, or plain low-fat yogurt. Low-sodium or reduced-sodium cheeses. Fats and Oils Tub margarines without trans fats. Light or reduced-fat mayonnaise and salad dressings (reduced sodium). Avocado. Safflower, olive, or canola oils. Natural peanut or almond butter. Other Unsalted popcorn and pretzels. The items listed above may not be a complete list of recommended foods or beverages. Contact your dietitian for more options. WHAT FOODS ARE NOT RECOMMENDED? Grains White bread. White pasta. White rice.  Refined cornbread. Bagels and croissants. Crackers that contain trans fat. Vegetables Creamed or fried vegetables. Vegetables in a cheese sauce. Regular canned vegetables. Regular canned tomato sauce and paste. Regular  tomato and vegetable juices. Fruits Dried fruits. Canned fruit in light or heavy syrup. Fruit juice. Meat and Other Protein Products Fatty cuts of meat. Ribs, chicken wings, bacon, sausage, bologna, salami, chitterlings, fatback, hot dogs, bratwurst, and packaged luncheon meats. Salted nuts and seeds. Canned beans with salt. Dairy Whole or 2% milk, cream, half-and-half, and cream cheese. Whole-fat or sweetened yogurt. Full-fat cheeses or blue cheese. Nondairy creamers and whipped toppings. Processed cheese, cheese spreads, or cheese curds. Condiments Onion and garlic salt, seasoned salt, table salt, and sea salt. Canned and packaged gravies. Worcestershire sauce. Tartar sauce. Barbecue sauce. Teriyaki sauce. Soy sauce, including reduced sodium. Steak sauce. Fish sauce. Oyster sauce. Cocktail sauce. Horseradish. Ketchup and mustard. Meat flavorings and tenderizers. Bouillon cubes. Hot sauce. Tabasco sauce. Marinades. Taco seasonings. Relishes. Fats and Oils Butter, stick margarine, lard, shortening, ghee, and bacon fat. Coconut, palm kernel, or palm oils. Regular salad dressings. Other Pickles and olives. Salted popcorn and pretzels. The items listed above may not be a complete list of foods and beverages to avoid. Contact your dietitian for more information. WHERE CAN I FIND MORE INFORMATION? National Heart, Lung, and Blood Institute: travelstabloid.com   This information is not intended to replace advice given to you by your health care provider. Make sure you discuss any questions you have with your health care provider.   Document Released: 03/27/2011 Document Revised: 04/28/2014 Document Reviewed: 02/09/2013 Elsevier Interactive Patient Education [employer].

## 2015-02-27 NOTE — Progress Notes (Signed)
BP 142/90 mmHg  Pulse 72  Temp(Src) 98.4 F (36.9 C) (Oral)  Wt 225 lb 8 oz (102.286 kg)   CC: blood pressure recheck  Subjective:    Patient ID: Randy Decker, male    DOB: 06-25-1955, 59 y.o.   MRN: [id number]  HPI: Randy Decker is a 59 y.o. male presenting on 02/27/2015 for Blood Pressure Check   Pt presents today for recheck blood pressure - found to be high at recent DOT physical. 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   DOT readings: 150/105, 149/100, 146/90. At home: 157/87, 138/89, 136/83, 140/85, 126/89, pulse of 60-70s BP Readings from Last 3 Encounters:  02/27/15 142/90  06/15/14 122/86  05/22/14 120/80   No HA, vision changes, CP/tightness, SOB, leg swelling.   He has started walking.  Known h/o gout.   Relevant past medical, surgical, family and social history reviewed and updated as indicated. Interim medical history since our last visit reviewed. Allergies and medications reviewed and updated. Current Outpatient Prescriptions on File Prior to Visit  Medication Sig  . aspirin 81 MG tablet Take 81 mg by mouth daily.  . vitamin B-12 (CYANOCOBALAMIN) 500 MCG tablet Take 500 mcg by mouth daily.  . Colchicine 0.6 MG CAPS Take 1 capsule by mouth daily as needed.  Marland Kitchen MITIGARE 0.6 MG CAPS TAKE 2 CAPSULES BY MOUTH ON FIRST DAY THEN ONE DAILY AS NEEDED FOR GOUT FLARE (Patient not taking: Reported on 02/27/2015)   No current facility-administered medications on file prior to visit.   Family History  Problem Relation Age of Onset  . Hypertension Father   . Aortic aneurysm Father 93    deceased  . Diabetes Neg Hx   . Cancer Neg Hx   . Stroke Neg Hx   . Colon cancer Neg Hx     Review of Systems Per HPI unless specifically indicated in ROS section     Objective:    BP 142/90 mmHg  Pulse 72  Temp(Src) 98.4 F (36.9 C) (Oral)  Wt 225 lb 8 oz (102.286 kg)  Wt Readings from Last 3 Encounters:  02/27/15 225 lb 8 oz (102.286 kg)  06/15/14 221 lb 8  oz (100.472 kg)  05/22/14 222 lb (100.699 kg)   Body mass index is 34.3 kg/(m^2).  Physical Exam  Constitutional: He appears well-developed and well-nourished. No distress.  HENT:  Mouth/Throat: Oropharynx is clear and moist. No oropharyngeal exudate.  Neck: No thyromegaly present.  Cardiovascular: Normal rate, regular rhythm, normal heart sounds and intact distal pulses.   No murmur heard. Pulmonary/Chest: Effort normal and breath sounds normal. No respiratory distress. He has no wheezes. He has no rales.  Musculoskeletal: He exhibits no edema.  Psychiatric: He has a normal mood and affect.  Nursing note and vitals reviewed.  Results for orders placed or performed in visit on 06/15/14  Lipid panel  Result Value Ref Range   Cholesterol 220 (H) 0 - 200 mg/dL   Triglycerides 90.0 0.0 - 149.0 mg/dL   HDL 42.20 >39.00 mg/dL   VLDL 18.0 0.0 - 40.0 mg/dL   LDL Cholesterol 160 (H) 0 - 99 mg/dL   Total CHOL/HDL Ratio 5    NonHDL 177.80   PSA  Result Value Ref Range   PSA 1.00 0.10 - 4.00 ng/mL  Hepatic Function Panel  Result Value Ref Range   Total Bilirubin 0.5 0.2 - 1.2 mg/dL   Bilirubin, Direct 0.1 0.0 - 0.3 mg/dL   Alkaline Phosphatase 52  39 - 117 U/L   AST 16 0 - 37 U/L   ALT 15 0 - 53 U/L   Total Protein 7.5 6.0 - 8.3 g/dL   Albumin 4.4 3.5 - 5.2 g/dL      Assessment & Plan:   Problem List Items Addressed This Visit    Obesity, Class I, BMI 30-34.9    Discussed healthy diet and lifestyle changes to affect sustainable weight loss.       Essential hypertension - Primary    Brings several readings of elevated blood pressures - will recommend we start low dose amlodipine 2.5mg  daily and recheck next visit. Discussed healthy diet and lifestyle changes to affect improvement in blood pressure. See pt handout on DASH diet. Pt agrees with plan.       Relevant Medications   amLODipine (NORVASC) 2.5 MG tablet       Follow up plan: Return in about 4 months (around 06/27/2015),  or as needed, for annual exam, prior fasting for blood work.

## 2015-02-27 NOTE — Assessment & Plan Note (Signed)
Brings several readings of elevated blood pressures - will recommend we start low dose amlodipine 2.5mg  daily and recheck next visit. Discussed healthy diet and lifestyle changes to affect improvement in blood pressure. See pt handout on DASH diet. Pt agrees with plan.

## 2015-03-09 ENCOUNTER — Telehealth: Payer: Self-pay | Admitting: Family Medicine

## 2015-03-09 NOTE — Telephone Encounter (Signed)
He will just need a nurse visit and to confirm that it is covered by his insurance prior to receiving it. (Otherwise it's ~$300) Thanks!

## 2015-03-09 NOTE — Telephone Encounter (Signed)
Ok to do. Thanks.  

## 2015-03-09 NOTE — Telephone Encounter (Signed)
Pt would like a shingles vaccine. Is this ok?

## 2015-03-23 ENCOUNTER — Ambulatory Visit (INDEPENDENT_AMBULATORY_CARE_PROVIDER_SITE_OTHER): Payer: 59 | Admitting: *Deleted

## 2015-03-23 DIAGNOSIS — Z23 Encounter for immunization: Secondary | ICD-10-CM

## 2015-04-11 ENCOUNTER — Encounter: Payer: Self-pay | Admitting: Family Medicine

## 2015-04-11 ENCOUNTER — Ambulatory Visit (INDEPENDENT_AMBULATORY_CARE_PROVIDER_SITE_OTHER): Payer: 59 | Admitting: Family Medicine

## 2015-04-11 VITALS — BP 130/84 | HR 87 | Temp 98.9°F | Wt 220.0 lb

## 2015-04-11 DIAGNOSIS — J029 Acute pharyngitis, unspecified: Secondary | ICD-10-CM

## 2015-04-11 DIAGNOSIS — I10 Essential (primary) hypertension: Secondary | ICD-10-CM

## 2015-04-11 DIAGNOSIS — N529 Male erectile dysfunction, unspecified: Secondary | ICD-10-CM | POA: Diagnosis not present

## 2015-04-11 MED ORDER — SILDENAFIL CITRATE 100 MG PO TABS: 50.0000 mg | ORAL_TABLET | Freq: Every day | ORAL | Status: DC | PRN

## 2015-04-11 NOTE — Assessment & Plan Note (Signed)
Discussed with patient. New relationship. Trial viagra 50-100mg . Discussed no use of nitrates, monitoring for priapism and headache. Update if any chest pain, dyspnea with sex.

## 2015-04-11 NOTE — Progress Notes (Signed)
BP 130/84 mmHg  Pulse 87  Temp(Src) 98.9 F (37.2 C) (Oral)  Wt 220 lb (99.791 kg)  SpO2 98%   CC: BP f/u visit, ST  Subjective:    Patient ID: Randy Decker, male    DOB: 12/30/1955, 59 y.o.   MRN: [id number]  HPI: Randy Decker is a 59 y.o. male presenting on 04/11/2015 for Follow-up and Sore Throat   See prior note for details. Seen here 02/2015 with elevated blood pressure readings. Started on amlodipine 2.5mg  daily. Discussed low salt diet and DASH diet handout provided. Pt had increased walking as well up to 5 miles/day. 5 lb weight loss noted.   Checks bp at home and well controlled (110s/80s).   3d h/o ST, some coughing last night. Mild congestion. + PNdrainage. Some sinus pressure discomfort. No ear or tooth pain, headache, wheezing or dyspnea. Has tried nothing OTC.  Friend with ST as well.  Temperature to 99.5. No smokers at home.  No h/o asthma.   Divorced a few years ago. New relationship. Trouble maintaining erection for last 2 months. Denies chest pain or tightness. Libido intact. Stress level.   Relevant past medical, surgical, family and social history reviewed and updated as indicated. Interim medical history since our last visit reviewed. Allergies and medications reviewed and updated. Current Outpatient Prescriptions on File Prior to Visit  Medication Sig  . amLODipine (NORVASC) 2.5 MG tablet Take 1 tablet (2.5 mg total) by mouth daily.  Marland Kitchen aspirin 81 MG tablet Take 81 mg by mouth daily.  . Colchicine 0.6 MG CAPS Take 1 capsule by mouth daily as needed.  Marland Kitchen MITIGARE 0.6 MG CAPS TAKE 2 CAPSULES BY MOUTH ON FIRST DAY THEN ONE DAILY AS NEEDED FOR GOUT FLARE  . vitamin B-12 (CYANOCOBALAMIN) 500 MCG tablet Take 500 mcg by mouth daily.   No current facility-administered medications on file prior to visit.    Review of Systems Per HPI unless specifically indicated in ROS section     Objective:    BP 130/84 mmHg  Pulse 87  Temp(Src) 98.9 F (37.2  C) (Oral)  Wt 220 lb (99.791 kg)  SpO2 98%  Wt Readings from Last 3 Encounters:  04/11/15 220 lb (99.791 kg)  02/27/15 225 lb 8 oz (102.286 kg)  06/15/14 221 lb 8 oz (100.472 kg)   Body mass index is 33.46 kg/(m^2).  Physical Exam  Constitutional: He appears well-developed and well-nourished. No distress.  HENT:  Head: Normocephalic and atraumatic.  Right Ear: Hearing, tympanic membrane, external ear and ear canal normal.  Left Ear: Hearing, tympanic membrane, external ear and ear canal normal.  Nose: Mucosal edema (mild) present. No rhinorrhea. Right sinus exhibits no maxillary sinus tenderness and no frontal sinus tenderness. Left sinus exhibits no maxillary sinus tenderness and no frontal sinus tenderness.  Mouth/Throat: Uvula is midline and mucous membranes are normal. Posterior oropharyngeal erythema present. No oropharyngeal exudate, posterior oropharyngeal edema or tonsillar abscesses.  Eyes: Conjunctivae and EOM are normal. Pupils are equal, round, and reactive to light. No scleral icterus.  Neck: Normal range of motion. Neck supple.  Cardiovascular: Normal rate, regular rhythm, normal heart sounds and intact distal pulses.   No murmur heard. Pulmonary/Chest: Effort normal and breath sounds normal. No respiratory distress. He has no wheezes. He has no rales.  Lymphadenopathy:    He has no cervical adenopathy.  Skin: Skin is warm and dry. No rash noted.  Nursing note and vitals reviewed.     Assessment &  Plan:   Problem List Items Addressed This Visit    Viral pharyngitis    No evidence of bacterial infection. Treat supportively as per instructions. Update if not improving as expected.       Essential hypertension - Primary    Significant improvement noted on low dose amlodipine. Continue. Pt motivated to continue working on weight loss in hopes of coming off medication.      Relevant Medications   sildenafil (VIAGRA) 100 MG tablet   Erectile dysfunction    Discussed  with patient. New relationship. Trial viagra 50-100mg . Discussed no use of nitrates, monitoring for priapism and headache. Update if any chest pain, dyspnea with sex.          Follow up plan: No Follow-up on file.

## 2015-04-11 NOTE — Assessment & Plan Note (Signed)
Significant improvement noted on low dose amlodipine. Continue. Pt motivated to continue working on weight loss in hopes of coming off medication.

## 2015-04-11 NOTE — Assessment & Plan Note (Signed)
No evidence of bacterial infection. Treat supportively as per instructions. Update if not improving as expected.

## 2015-04-11 NOTE — Patient Instructions (Signed)
Blood pressure is looking well. Continue amlodipine 2.5mg  daily. You have acute viral pharyngitis. This usually takes 7-10 days to resolve.  Push fluids and plenty of rest. May use ibuprofen for throat inflammation. Salt water gargles. Suck on cold things like popsicles or warm things like herbal teas (whichever soothes the throat better). Return if fever >101, worsening pain, or trouble opening/closing mouth. Good to see you today, call clinic with questions.  For possible ED - trial viagra 50-100mg  as needed. Coupon provided.

## 2015-08-07 ENCOUNTER — Other Ambulatory Visit: Payer: Self-pay | Admitting: Family Medicine

## 2015-08-07 ENCOUNTER — Ambulatory Visit (INDEPENDENT_AMBULATORY_CARE_PROVIDER_SITE_OTHER): Payer: BLUE CROSS/BLUE SHIELD | Admitting: Family Medicine

## 2015-08-07 ENCOUNTER — Encounter: Payer: Self-pay | Admitting: Family Medicine

## 2015-08-07 VITALS — BP 130/84 | HR 77 | Temp 98.0°F | Ht 68.0 in | Wt 207.4 lb

## 2015-08-07 DIAGNOSIS — IMO0001 Reserved for inherently not codable concepts without codable children: Secondary | ICD-10-CM

## 2015-08-07 DIAGNOSIS — Z125 Encounter for screening for malignant neoplasm of prostate: Secondary | ICD-10-CM

## 2015-08-07 DIAGNOSIS — Z1159 Encounter for screening for other viral diseases: Secondary | ICD-10-CM | POA: Diagnosis not present

## 2015-08-07 DIAGNOSIS — E785 Hyperlipidemia, unspecified: Secondary | ICD-10-CM | POA: Diagnosis not present

## 2015-08-07 DIAGNOSIS — Z114 Encounter for screening for human immunodeficiency virus [HIV]: Secondary | ICD-10-CM | POA: Diagnosis not present

## 2015-08-07 DIAGNOSIS — E669 Obesity, unspecified: Secondary | ICD-10-CM

## 2015-08-07 DIAGNOSIS — Z8249 Family history of ischemic heart disease and other diseases of the circulatory system: Secondary | ICD-10-CM

## 2015-08-07 DIAGNOSIS — Z0000 Encounter for general adult medical examination without abnormal findings: Secondary | ICD-10-CM | POA: Diagnosis not present

## 2015-08-07 DIAGNOSIS — N529 Male erectile dysfunction, unspecified: Secondary | ICD-10-CM

## 2015-08-07 DIAGNOSIS — R030 Elevated blood-pressure reading, without diagnosis of hypertension: Secondary | ICD-10-CM

## 2015-08-07 MED ORDER — SILDENAFIL CITRATE 100 MG PO TABS: 50.0000 mg | ORAL_TABLET | Freq: Every day | ORAL | Status: DC | PRN

## 2015-08-07 MED ORDER — COLCHICINE 0.6 MG PO CAPS: ORAL_CAPSULE | ORAL | Status: DC

## 2015-08-07 NOTE — Assessment & Plan Note (Signed)
Check FLP today. Off meds.

## 2015-08-07 NOTE — Assessment & Plan Note (Signed)
Preventative protocols reviewed and updated unless pt declined. Discussed healthy diet and lifestyle.  

## 2015-08-07 NOTE — Progress Notes (Signed)
BP 130/84 mmHg  Pulse 77  Temp(Src) 98 F (36.7 C) (Oral)  Ht 5\' 8"  (1.727 m)  Wt 207 lb 6.4 oz (94.076 kg)  BMI 31.54 kg/m2  SpO2 99%   CC: CPE  Subjective:    Patient ID: Randy Decker, male    DOB: 02-22-1956, 60 y.o.   MRN: [id number]  HPI: Randy Decker is a 61 y.o. male presenting on 08/07/2015 for Annual Exam   HTN - prior on amlodipine, stopped this. Worked on healthy diet and lifestyle changes - (low salt diet, DASH diet) and increased walking to 5 mi/day. Total 18lb weight loss! Now off amlodipine. Stays active mowing for work.  Fmhx AAA (father) age 61yo - ex smoker. No other fmhx AAA.Marland Kitchen Ex smoker 5 PY hx. Requests screen next year.   Requests hep C testing.   Preventative: COLONOSCOPY Date: 2015 WNL Carlean Purl) Prostate cancer screening - discussed, continue screening Flu shot - yearly Tetanus - 2012 Hep B series 2012 zostavax 2016 Seat belt use discussed. Sunscreen use discussed. No changing moles on skin.   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 Interim medical history since our last visit reviewed. Allergies and medications reviewed and updated. Current Outpatient Prescriptions on File Prior to Visit  Medication Sig  . aspirin 81 MG tablet Take 81 mg by mouth daily.  . vitamin B-12 (CYANOCOBALAMIN) 500 MCG tablet Take 500 mcg by mouth daily.   No current facility-administered medications on file prior to visit.    Review of Systems  Constitutional: Negative for fever, chills, activity change, appetite change, fatigue and unexpected weight change.  HENT: Positive for sore throat. Negative for hearing loss.   Eyes: Negative for visual disturbance.  Respiratory: Positive for cough (after URI). Negative for chest tightness,  shortness of breath and wheezing.   Cardiovascular: Negative for chest pain, palpitations and leg swelling.  Gastrointestinal: Negative for nausea, vomiting, abdominal pain, diarrhea, constipation, blood in stool and abdominal distention.  Genitourinary: Negative for hematuria and difficulty urinating.  Musculoskeletal: Negative for myalgias, arthralgias and neck pain.  Skin: Negative for rash.  Neurological: Negative for dizziness, seizures, syncope and headaches.  Hematological: Negative for adenopathy. Does not bruise/bleed easily.  Psychiatric/Behavioral: Negative for dysphoric mood. The patient is not nervous/anxious.    Per HPI unless specifically indicated in ROS section     Objective:    BP 130/84 mmHg  Pulse 77  Temp(Src) 98 F (36.7 C) (Oral)  Ht 5\' 8"  (1.727 m)  Wt 207 lb 6.4 oz (94.076 kg)  BMI 31.54 kg/m2  SpO2 99%  Wt Readings from Last 3 Encounters:  08/07/15 207 lb 6.4 oz (94.076 kg)  04/11/15 220 lb (99.791 kg)  02/27/15 225 lb 8 oz (102.286 kg)    Physical Exam  Constitutional: He is oriented to person, place, and time. He appears well-developed and well-nourished. No distress.  HENT:  Head: Normocephalic and atraumatic.  Right Ear: Hearing, tympanic membrane, external ear and ear canal normal.  Left Ear: Hearing, tympanic membrane, external ear and ear canal normal.  Nose: Nose normal.  Mouth/Throat: Uvula is midline, oropharynx is clear and moist and mucous membranes are normal. No oropharyngeal exudate, posterior oropharyngeal edema or posterior oropharyngeal erythema.  Eyes: Conjunctivae and EOM are normal. Pupils are equal, round, and reactive to light.  No scleral icterus.  Neck: Normal range of motion. Neck supple. No thyromegaly present.  Cardiovascular: Normal rate, regular rhythm, normal heart sounds and intact distal pulses.   No murmur heard. Pulses:      Radial pulses are 2+ on the right side, and 2+ on the left side.  Pulmonary/Chest: Effort  normal and breath sounds normal. No respiratory distress. He has no wheezes. He has no rales.  Abdominal: Soft. Bowel sounds are normal. He exhibits no distension and no mass. There is no tenderness. There is no rebound and no guarding.  Genitourinary: Rectum normal and prostate normal. Rectal exam shows no external hemorrhoid, no internal hemorrhoid, no fissure, no mass, no tenderness and anal tone normal. Prostate is not enlarged and not tender.  Musculoskeletal: Normal range of motion. He exhibits no edema.  Lymphadenopathy:    He has no cervical adenopathy.  Neurological: He is alert and oriented to person, place, and time.  CN grossly intact, station and gait intact  Skin: Skin is warm and dry. No rash noted.  Psychiatric: He has a normal mood and affect. His behavior is normal. Judgment and thought content normal.  Nursing note and vitals reviewed.     Assessment & Plan:   Problem List Items Addressed This Visit    Healthcare maintenance - Primary    Preventative protocols reviewed and updated unless pt declined. Discussed healthy diet and lifestyle.       AAA family hx    Remote minimal smoker (5 PY hx, quit 30+ yrs ago).  Reviewed with patient. Pt requests screen to be completed next year - will check AAA duplex next year at age 89.      HLD (hyperlipidemia)    Check FLP today. Off meds.      Relevant Medications   sildenafil (VIAGRA) 100 MG tablet   Other Relevant Orders   Lipid panel   Comprehensive metabolic panel   TSH   Elevated blood pressure reading without diagnosis of hypertension    Marked improvement with weight loss, was able to stop amlodipine. bp remains well controlled. Congratulated on healthy diet and lifestyle changes, will continue to monitor at home.      Obesity, Class I, BMI 30-34.9    Congratulated on weight loss noted.      Erectile dysfunction    viagra working well - refilled.        Other Visit Diagnoses    Need for hepatitis C  screening test        Relevant Orders    Hepatitis C antibody, reflex    Screening for HIV (human immunodeficiency virus)        Relevant Orders    HIV antibody    Special screening for malignant neoplasm of prostate        Relevant Orders    PSA        Follow up plan: Return in about 1 year (around 08/06/2016), or as needed, for annual exam, prior fasting for blood work.  Ria Bush, MD

## 2015-08-07 NOTE — Assessment & Plan Note (Signed)
viagra working well - refilled.

## 2015-08-07 NOTE — Progress Notes (Signed)
Pre visit review using our clinic review tool, if applicable. No additional management support is needed unless otherwise documented below in the visit note. 

## 2015-08-07 NOTE — Assessment & Plan Note (Addendum)
Remote minimal smoker (5 PY hx, quit 30+ yrs ago).  Reviewed with patient. Pt requests screen to be completed next year - will check AAA duplex next year at age 60.

## 2015-08-07 NOTE — Assessment & Plan Note (Signed)
Congratulated on weight loss noted.  

## 2015-08-07 NOTE — Patient Instructions (Addendum)
Labs today You are doing well today. Great job with blood pressure control and weight loss! Keep it up. Return as needed or in 1 year for next physical.  Health Maintenance, Male A healthy lifestyle and preventative care can promote health and wellness.  Maintain regular health, dental, and eye exams.  Eat a healthy diet. Foods like vegetables, fruits, whole grains, low-fat dairy products, and lean protein foods contain the nutrients you need and are low in calories. Decrease your intake of foods high in solid fats, added sugars, and salt. Get information about a proper diet from your health care provider, if necessary.  Regular physical exercise is one of the most important things you can do for your health. Most adults should get at least 150 minutes of moderate-intensity exercise (any activity that increases your heart rate and causes you to sweat) each week. In addition, most adults need muscle-strengthening exercises on 2 or more days a week.   Maintain a healthy weight. The body mass index (BMI) is a screening tool to identify possible weight problems. It provides an estimate of body fat based on height and weight. Your health care provider can find your BMI and can help you achieve or maintain a healthy weight. For males 20 years and older:  A BMI below 18.5 is considered underweight.  A BMI of 18.5 to 24.9 is normal.  A BMI of 25 to 29.9 is considered overweight.  A BMI of 30 and above is considered obese.  Maintain normal blood lipids and cholesterol by exercising and minimizing your intake of saturated fat. Eat a balanced diet with plenty of fruits and vegetables. Blood tests for lipids and cholesterol should begin at age 84 and be repeated every 5 years. If your lipid or cholesterol levels are high, you are over age 12, or you are at high risk for heart disease, you may need your cholesterol levels checked more frequently.Ongoing high lipid and cholesterol levels should be  treated with medicines if diet and exercise are not working.  If you smoke, find out from your health care provider how to quit. If you do not use tobacco, do not start.  Lung cancer screening is recommended for adults aged 81-80 years who are at high risk for developing lung cancer because of a history of smoking. A yearly low-dose CT scan of the lungs is recommended for people who have at least a 30-pack-year history of smoking and are current smokers or have quit within the past 15 years. A pack year of smoking is smoking an average of 1 pack of cigarettes a day for 1 year (for example, a 30-pack-year history of smoking could mean smoking 1 pack a day for 30 years or 2 packs a day for 15 years). Yearly screening should continue until the smoker has stopped smoking for at least 15 years. Yearly screening should be stopped for people who develop a health problem that would prevent them from having lung cancer treatment.  If you choose to drink alcohol, do not have more than 2 drinks per day. One drink is considered to be 12 oz (360 mL) of beer, 5 oz (150 mL) of wine, or 1.5 oz (45 mL) of liquor.  Avoid the use of street drugs. Do not share needles with anyone. Ask for help if you need support or instructions about stopping the use of drugs.  High blood pressure causes heart disease and increases the risk of stroke. High blood pressure is more likely to develop in:  People who have blood pressure in the end of the normal range (100-139/85-89 mm Hg).  People who are overweight or obese.  People who are African American.  If you are 89-23 years of age, have your blood pressure checked every 3-5 years. If you are 33 years of age or older, have your blood pressure checked every year. You should have your blood pressure measured twice--once when you are at a hospital or clinic, and once when you are not at a hospital or clinic. Record the average of the two measurements. To check your blood pressure  when you are not at a hospital or clinic, you can use:  An automated blood pressure machine at a pharmacy.  A home blood pressure monitor.  If you are 66-39 years old, ask your health care provider if you should take aspirin to prevent heart disease.  Diabetes screening involves taking a blood sample to check your fasting blood sugar level. This should be done once every 3 years after age 72 if you are at a normal weight and without risk factors for diabetes. Testing should be considered at a younger age or be carried out more frequently if you are overweight and have at least 1 risk factor for diabetes.  Colorectal cancer can be detected and often prevented. Most routine colorectal cancer screening begins at the age of 22 and continues through age 32. However, your health care provider may recommend screening at an earlier age if you have risk factors for colon cancer. On a yearly basis, your health care provider may provide home test kits to check for hidden blood in the stool. A small camera at the end of a tube may be used to directly examine the colon (sigmoidoscopy or colonoscopy) to detect the earliest forms of colorectal cancer. Talk to your health care provider about this at age 47 when routine screening begins. A direct exam of the colon should be repeated every 5-10 years through age 47, unless early forms of precancerous polyps or small growths are found.  People who are at an increased risk for hepatitis B should be screened for this virus. You are considered at high risk for hepatitis B if:  You were born in a country where hepatitis B occurs often. Talk with your health care provider about which countries are considered high risk.  Your parents were born in a high-risk country and you have not received a shot to protect against hepatitis B (hepatitis B vaccine).  You have HIV or AIDS.  You use needles to inject street drugs.  You live with, or have sex with, someone who has  hepatitis B.  You are a man who has sex with other men (MSM).  You get hemodialysis treatment.  You take certain medicines for conditions like cancer, organ transplantation, and autoimmune conditions.  Hepatitis C blood testing is recommended for all people born from [birth year] through 1966 and any individual with known risk factors for hepatitis C.  Healthy men should no longer receive prostate-specific antigen (PSA) blood tests as part of routine cancer screening. Talk to your health care provider about prostate cancer screening.  Testicular cancer screening is not recommended for adolescents or adult males who have no symptoms. Screening includes self-exam, a health care provider exam, and other screening tests. Consult with your health care provider about any symptoms you have or any concerns you have about testicular cancer.  Practice safe sex. Use condoms and avoid high-risk sexual practices to reduce the spread of  sexually transmitted infections (STIs).  You should be screened for STIs, including gonorrhea and chlamydia if:  You are sexually active and are younger than 24 years.  You are older than 24 years, and your health care provider tells you that you are at risk for this type of infection.  Your sexual activity has changed since you were last screened, and you are at an increased risk for chlamydia or gonorrhea. Ask your health care provider if you are at risk.  If you are at risk of being infected with HIV, it is recommended that you take a prescription medicine daily to prevent HIV infection. This is called pre-exposure prophylaxis (PrEP). You are considered at risk if:  You are a man who has sex with other men (MSM).  You are a heterosexual man who is sexually active with multiple partners.  You take drugs by injection.  You are sexually active with a partner who has HIV.  Talk with your health care provider about whether you are at high risk of being infected with HIV. If  you choose to begin PrEP, you should first be tested for HIV. You should then be tested every 3 months for as long as you are taking PrEP.  Use sunscreen. Apply sunscreen liberally and repeatedly throughout the day. You should seek shade when your shadow is shorter than you. Protect yourself by wearing long sleeves, pants, a wide-brimmed hat, and sunglasses year round whenever you are outdoors.  Tell your health care provider of new moles or changes in moles, especially if there is a change in shape or color. Also, tell your health care provider if a mole is larger than the size of a pencil eraser.  A one-time screening for abdominal aortic aneurysm (AAA) and surgical repair of large AAAs by ultrasound is recommended for men aged 50-75 years who are current or former smokers.  Stay current with your vaccines (immunizations).   This information is not intended to replace advice given to you by your health care provider. Make sure you discuss any questions you have with your health care provider.   Document Released: 10/04/2007 Document Revised: 04/28/2014 Document Reviewed: 09/02/2010 Elsevier Interactive Patient Education [employer].

## 2015-08-07 NOTE — Assessment & Plan Note (Signed)
Marked improvement with weight loss, was able to stop amlodipine. bp remains well controlled. Congratulated on healthy diet and lifestyle changes, will continue to monitor at home.

## 2015-08-08 ENCOUNTER — Encounter: Payer: Self-pay | Admitting: *Deleted

## 2015-08-08 LAB — COMPREHENSIVE METABOLIC PANEL
ALBUMIN: 4.5 g/dL (ref 3.5–5.2)
ALK PHOS: 45 U/L (ref 39–117)
ALT: 14 U/L (ref 0–53)
AST: 14 U/L (ref 0–37)
BILIRUBIN TOTAL: 0.7 mg/dL (ref 0.2–1.2)
BUN: 16 mg/dL (ref 6–23)
CO2: 29 mEq/L (ref 19–32)
CREATININE: 0.99 mg/dL (ref 0.40–1.50)
Calcium: 9.8 mg/dL (ref 8.4–10.5)
Chloride: 103 mEq/L (ref 96–112)
GFR: 81.83 mL/min (ref >60.00)
GLUCOSE: 87 mg/dL (ref 70–99)
POTASSIUM: 4.8 meq/L (ref 3.5–5.1)
SODIUM: 140 meq/L (ref 135–145)
TOTAL PROTEIN: 7.5 g/dL (ref 6.0–8.3)

## 2015-08-08 LAB — LIPID PANEL
Cholesterol: 224 mg/dL — ABNORMAL HIGH (ref 0–200)
HDL: 48 mg/dL (ref >39.00)
LDL Cholesterol: 142 mg/dL — ABNORMAL HIGH (ref 0–99)
NonHDL: 176.42
Total CHOL/HDL Ratio: 5
Triglycerides: 174 mg/dL — ABNORMAL HIGH (ref 0.0–149.0)
VLDL: 34.8 mg/dL (ref 0.0–40.0)

## 2015-08-08 LAB — PSA: PSA: 1.01 ng/mL (ref 0.10–4.00)

## 2015-08-08 LAB — TSH: TSH: 1.35 u[IU]/mL (ref 0.35–4.50)

## 2015-08-08 LAB — HIV ANTIBODY (ROUTINE TESTING W REFLEX): HIV: NONREACTIVE

## 2015-08-08 LAB — HEPATITIS C ANTIBODY: HCV Ab: NEGATIVE

## 2015-08-13 ENCOUNTER — Encounter: Payer: Self-pay | Admitting: *Deleted

## 2016-01-30 DIAGNOSIS — Z23 Encounter for immunization: Secondary | ICD-10-CM | POA: Diagnosis not present

## 2016-03-28 ENCOUNTER — Other Ambulatory Visit: Payer: Self-pay | Admitting: Family Medicine

## 2016-08-11 ENCOUNTER — Encounter: Payer: Self-pay | Admitting: Family Medicine

## 2016-08-11 ENCOUNTER — Ambulatory Visit (INDEPENDENT_AMBULATORY_CARE_PROVIDER_SITE_OTHER): Payer: BLUE CROSS/BLUE SHIELD | Admitting: Family Medicine

## 2016-08-11 VITALS — BP 154/96 | HR 88 | Temp 97.9°F | Ht 68.0 in | Wt 224.5 lb

## 2016-08-11 DIAGNOSIS — E66811 Obesity, class 1: Secondary | ICD-10-CM

## 2016-08-11 DIAGNOSIS — N529 Male erectile dysfunction, unspecified: Secondary | ICD-10-CM

## 2016-08-11 DIAGNOSIS — Z0000 Encounter for general adult medical examination without abnormal findings: Secondary | ICD-10-CM | POA: Diagnosis not present

## 2016-08-11 DIAGNOSIS — R030 Elevated blood-pressure reading, without diagnosis of hypertension: Secondary | ICD-10-CM

## 2016-08-11 DIAGNOSIS — E785 Hyperlipidemia, unspecified: Secondary | ICD-10-CM | POA: Diagnosis not present

## 2016-08-11 DIAGNOSIS — Z125 Encounter for screening for malignant neoplasm of prostate: Secondary | ICD-10-CM | POA: Diagnosis not present

## 2016-08-11 DIAGNOSIS — E669 Obesity, unspecified: Secondary | ICD-10-CM

## 2016-08-11 DIAGNOSIS — Z8249 Family history of ischemic heart disease and other diseases of the circulatory system: Secondary | ICD-10-CM | POA: Diagnosis not present

## 2016-08-11 MED ORDER — SILDENAFIL CITRATE 20 MG PO TABS: 40.0000 mg | ORAL_TABLET | Freq: Every day | ORAL | 11 refills | Status: DC | PRN

## 2016-08-11 NOTE — Assessment & Plan Note (Addendum)
Father at age 62 with AAA rupture. Pt remote minimal smoker. Will check aaa screen today given fm history.   ADDENDUM ==> pt will go to [facility] to have screenings done.

## 2016-08-11 NOTE — Progress Notes (Signed)
Pre visit review using our clinic review tool, if applicable. No additional management support is needed unless otherwise documented below in the visit note. 

## 2016-08-11 NOTE — Assessment & Plan Note (Signed)
Update labs.  

## 2016-08-11 NOTE — Patient Instructions (Addendum)
Labs today. Continue monitoring blood pressure at home. If persistently > 140/90 let me know to discuss further treatment. For now, work on low salt, high potassium foods and drink plenty of water, work on weight loss.  Sildenafil (generic viagra) refilled today.  Return as needed or in 1 year for next physical   Health Maintenance, Male A healthy lifestyle and preventive care is important for your health and wellness. Ask your health care provider about what schedule of regular examinations is right for you. What should I know about weight and diet?  Eat a Healthy Diet  Eat plenty of vegetables, fruits, whole grains, low-fat dairy products, and lean protein.  Do not eat a lot of foods high in solid fats, added sugars, or salt. Maintain a Healthy Weight  Regular exercise can help you achieve or maintain a healthy weight. You should:  Do at least 150 minutes of exercise each week. The exercise should increase your heart rate and make you sweat (moderate-intensity exercise).  Do strength-training exercises at least twice a week. Watch Your Levels of Cholesterol and Blood Lipids  Have your blood tested for lipids and cholesterol every 5 years starting at 62 years of age. If you are at high risk for heart disease, you should start having your blood tested when you are 62 years old. You may need to have your cholesterol levels checked more often if:  Your lipid or cholesterol levels are high.  You are older than 62 years of age.  You are at high risk for heart disease. What should I know about cancer screening? Many types of cancers can be detected early and may often be prevented. Lung Cancer  You should be screened every year for lung cancer if:  You are a current smoker who has smoked for at least 30 years.  You are a former smoker who has quit within the past 15 years.  Talk to your health care provider about your screening options, when you should start screening, and how often  you should be screened. Colorectal Cancer  Routine colorectal cancer screening usually begins at 62 years of age and should be repeated every 5-10 years until you are 62 years old. You may need to be screened more often if early forms of precancerous polyps or small growths are found. Your health care provider may recommend screening at an earlier age if you have risk factors for colon cancer.  Your health care provider may recommend using home test kits to check for hidden blood in the stool.  A small camera at the end of a tube can be used to examine your colon (sigmoidoscopy or colonoscopy). This checks for the earliest forms of colorectal cancer. Prostate and Testicular Cancer  Depending on your age and overall health, your health care provider may do certain tests to screen for prostate and testicular cancer.  Talk to your health care provider about any symptoms or concerns you have about testicular or prostate cancer. Skin Cancer  Check your skin from head to toe regularly.  Tell your health care provider about any new moles or changes in moles, especially if:  There is a change in a mole's size, shape, or color.  You have a mole that is larger than a pencil eraser.  Always use sunscreen. Apply sunscreen liberally and repeat throughout the day.  Protect yourself by wearing long sleeves, pants, a wide-brimmed hat, and sunglasses when outside. What should I know about heart disease, diabetes, and high blood pressure?  If you are 56-44 years of age, have your blood pressure checked every 3-5 years. If you are 41 years of age or older, have your blood pressure checked every year. You should have your blood pressure measured twice-once when you are at a hospital or clinic, and once when you are not at a hospital or clinic. Record the average of the two measurements. To check your blood pressure when you are not at a hospital or clinic, you can use:  An automated blood pressure machine  at a pharmacy.  A home blood pressure monitor.  Talk to your health care provider about your target blood pressure.  If you are between 53-88 years old, ask your health care provider if you should take aspirin to prevent heart disease.  Have regular diabetes screenings by checking your fasting blood sugar level.  If you are at a normal weight and have a low risk for diabetes, have this test once every three years after the age of 28.  If you are overweight and have a high risk for diabetes, consider being tested at a younger age or more often.  A one-time screening for abdominal aortic aneurysm (AAA) by ultrasound is recommended for men aged 65-75 years who are current or former smokers. What should I know about preventing infection? Hepatitis B  If you have a higher risk for hepatitis B, you should be screened for this virus. Talk with your health care provider to find out if you are at risk for hepatitis B infection. Hepatitis C  Blood testing is recommended for:  Everyone born from [birth year] through 1965.  Anyone with known risk factors for hepatitis C. Sexually Transmitted Diseases (STDs)  You should be screened each year for STDs including gonorrhea and chlamydia if:  You are sexually active and are younger than 62 years of age.  You are older than 62 years of age and your health care provider tells you that you are at risk for this type of infection.  Your sexual activity has changed since you were last screened and you are at an increased risk for chlamydia or gonorrhea. Ask your health care provider if you are at risk.  Talk with your health care provider about whether you are at high risk of being infected with HIV. Your health care provider may recommend a prescription medicine to help prevent HIV infection. What else can I do?  Schedule regular health, dental, and eye exams.  Stay current with your vaccines (immunizations).  Do not use any tobacco products, such as  cigarettes, chewing tobacco, and e-cigarettes. If you need help quitting, ask your health care provider.  Limit alcohol intake to no more than 2 drinks per day. One drink equals 12 ounces of beer, 5 ounces of wine, or 1 ounces of hard liquor.  Do not use street drugs.  Do not share needles.  Ask your health care provider for help if you need support or information about quitting drugs.  Tell your health care provider if you often feel depressed.  Tell your health care provider if you have ever been abused or do not feel safe at home. This information is not intended to replace advice given to you by your health care provider. Make sure you discuss any questions you have with your health care provider. Document Released: 10/04/2007 Document Revised: 12/05/2015 Document Reviewed: 01/09/2015 Elsevier Interactive Patient Education  2017 [employer].

## 2016-08-11 NOTE — Assessment & Plan Note (Addendum)
Deteriorated after recent weight gain noted. Elevated on recheck as well.  Discussed with patient - he will monitor blood pressures at home and let us know if staying elevated to discuss treatment. In interim, discussed healthy diet and lifestyle changes to affect good blood pressure control.

## 2016-08-11 NOTE — Assessment & Plan Note (Signed)
Discussed weight gain noted. Discussed healthy diet and lifestyle changes to affect sustainable weight loss.

## 2016-08-11 NOTE — Progress Notes (Addendum)
BP (!) 154/96 (BP Location: Right Arm, Cuff Size: Large)   Pulse 88   Temp 97.9 F (36.6 C) (Oral)   Ht 5\' 8"  (1.727 m)   Wt 224 lb 8 oz (101.8 kg)   BMI 34.14 kg/m    CC: CPE Subjective:    Patient ID: Randy Decker, male    DOB: 05-15-1955, 61 y.o.   MRN: [id number]  HPI: Randy Decker is a 61 y.o. male presenting on 08/11/2016 for Annual Exam   Gout - no recent colchicine use.   Preventative: COLONOSCOPY Date: 2015 WNL Carlean Purl) Prostate cancer screening - discussed, continue screening  Flu shot - yearly Tetanus - 2012 Hep B series 2012 zostavax 2016 Seat belt use discussed. Sunscreen use discussed. No changing moles on skin.  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 Interim medical history since our last visit reviewed. Allergies and medications reviewed and updated. Outpatient Medications Prior to Visit  Medication Sig Dispense Refill  . aspirin 81 MG tablet Take 81 mg by mouth daily.    . vitamin B-12 (CYANOCOBALAMIN) 500 MCG tablet Take 500 mcg by mouth daily.    . sildenafil (VIAGRA) 100 MG tablet Take 0.5-1 tablets (50-100 mg total) by mouth daily as needed for erectile dysfunction. 9 tablet 6  . Colchicine (MITIGARE) 0.6 MG CAPS TAKE 2 CAPSULES BY MOUTH ON FIRST DAY THEN ONE DAILY AS NEEDED FOR GOUT FLARE (Patient not taking: Reported on 08/11/2016) 30 capsule 1  . VIAGRA 100 MG tablet TAKE 0.5-1 TABLET BY MOUTH DAILY AS NEEDED FOR ERECTILE DYSFUNCTION (INS. COVERS 3 IN 31 DAYS) 9 tablet 3   No facility-administered medications prior to visit.      Per HPI unless specifically indicated in ROS section below Review of Systems  Constitutional: Negative for activity change, appetite  change, chills, fatigue, fever and unexpected weight change.  HENT: Negative for hearing loss.   Eyes: Negative for visual disturbance.  Respiratory: Negative for cough, chest tightness, shortness of breath and wheezing.        Noticing decreased stamina  Cardiovascular: Negative for chest pain, palpitations and leg swelling.  Gastrointestinal: Negative for abdominal distention, abdominal pain, blood in stool, constipation, diarrhea, nausea and vomiting.  Genitourinary: Negative for difficulty urinating and hematuria.  Musculoskeletal: Negative for arthralgias, myalgias and neck pain.  Skin: Negative for rash.  Neurological: Negative for dizziness, seizures, syncope and headaches.  Hematological: Negative for adenopathy. Does not bruise/bleed easily.  Psychiatric/Behavioral: Negative for dysphoric mood. The patient is not nervous/anxious.        Objective:    BP (!) 154/96 (BP Location: Right Arm, Cuff Size: Large)   Pulse 88   Temp 97.9 F (36.6 C) (Oral)   Ht 5\' 8"  (1.727 m)   Wt 224 lb 8 oz (101.8 kg)   BMI 34.14 kg/m   Wt Readings from Last 3 Encounters:  08/11/16 224 lb 8 oz (101.8 kg)  08/07/15 207 lb 6.4 oz (94.1 kg)  04/11/15 220 lb (99.8 kg)    Physical Exam  Constitutional: He is oriented to person, place, and time. He appears well-developed and well-nourished. No distress.  HENT:  Head: Normocephalic and atraumatic.  Right Ear: Hearing, tympanic membrane, external ear and  ear canal normal.  Left Ear: Hearing, tympanic membrane, external ear and ear canal normal.  Nose: Nose normal.  Mouth/Throat: Uvula is midline, oropharynx is clear and moist and mucous membranes are normal. No oropharyngeal exudate, posterior oropharyngeal edema or posterior oropharyngeal erythema.  Eyes: Conjunctivae and EOM are normal. Pupils are equal, round, and reactive to light. No scleral icterus.  Neck: Normal range of motion. Neck supple. Carotid bruit is not present. No thyromegaly  present.  Cardiovascular: Normal rate, regular rhythm, normal heart sounds and intact distal pulses.   No murmur heard. Pulses:      Radial pulses are 2+ on the right side, and 2+ on the left side.  Pulmonary/Chest: Effort normal and breath sounds normal. No respiratory distress. He has no wheezes. He has no rales.  Abdominal: Soft. Bowel sounds are normal. He exhibits no distension and no mass. There is no tenderness. There is no rebound and no guarding.  Genitourinary: Prostate normal. Rectal exam shows external hemorrhoid (non inflamed). Rectal exam shows no internal hemorrhoid, no fissure, no mass, no tenderness and anal tone normal. Prostate is not enlarged (15gm) and not tender.  Musculoskeletal: Normal range of motion. He exhibits no edema.  Lymphadenopathy:    He has no cervical adenopathy.  Neurological: He is alert and oriented to person, place, and time.  CN grossly intact, station and gait intact  Skin: Skin is warm and dry. No rash noted.  Psychiatric: He has a normal mood and affect. His behavior is normal. Judgment and thought content normal.  Nursing note and vitals reviewed.  Results for orders placed or performed in visit on 08/07/15  Hepatitis C antibody  Result Value Ref Range   HCV Ab NEGATIVE NEGATIVE      Assessment & Plan:   Problem List Items Addressed This Visit    Elevated blood pressure reading without diagnosis of hypertension    Deteriorated after recent weight gain noted. Elevated on recheck as well.  Discussed with patient - he will monitor blood pressures at home and let us know if staying elevated to discuss treatment. In interim, discussed healthy diet and lifestyle changes to affect good blood pressure control.       Erectile dysfunction    viagra has been working well.  Discussed generic sildenafil.      Family history of abdominal aortic aneurysm (AAA)    Father at age 82 with AAA rupture. Pt remote minimal smoker. Will check aaa screen  today given fm history.   ADDENDUM ==> pt will go to [facility] to have screenings done.       Healthcare maintenance - Primary    Preventative protocols reviewed and updated unless pt declined. Discussed healthy diet and lifestyle.       HLD (hyperlipidemia)    Update labs.       Relevant Medications   sildenafil (REVATIO) 20 MG tablet   Other Relevant Orders   Lipid panel   Basic metabolic panel   Obesity, Class I, BMI 30-34.9    Discussed weight gain noted. Discussed healthy diet and lifestyle changes to affect sustainable weight loss.        Other Visit Diagnoses    Special screening for malignant neoplasm of prostate       Relevant Orders   PSA       Follow up plan: Return in about 1 year (around 08/11/2017) for annual exam, prior fasting for blood work.  Ria Bush, MD

## 2016-08-11 NOTE — Assessment & Plan Note (Signed)
viagra has been working well.  Discussed generic sildenafil.

## 2016-08-11 NOTE — Assessment & Plan Note (Signed)
Preventative protocols reviewed and updated unless pt declined. Discussed healthy diet and lifestyle.  

## 2016-08-11 NOTE — Addendum Note (Signed)
Addended by: Ria Bush on: 08/11/2016 08:21 PM   Modules accepted: Orders

## 2016-08-12 ENCOUNTER — Telehealth: Payer: Self-pay

## 2016-08-12 ENCOUNTER — Other Ambulatory Visit: Payer: Self-pay | Admitting: Family Medicine

## 2016-08-12 DIAGNOSIS — Z136 Encounter for screening for cardiovascular disorders: Secondary | ICD-10-CM

## 2016-08-12 LAB — BASIC METABOLIC PANEL
BUN: 11 mg/dL (ref 6–23)
CHLORIDE: 104 meq/L (ref 96–112)
CO2: 28 meq/L (ref 19–32)
Calcium: 9.3 mg/dL (ref 8.4–10.5)
Creatinine, Ser: 0.92 mg/dL (ref 0.40–1.50)
GFR: 88.76 mL/min (ref >60.00)
GLUCOSE: 89 mg/dL (ref 70–99)
Potassium: 4 mEq/L (ref 3.5–5.1)
SODIUM: 140 meq/L (ref 135–145)

## 2016-08-12 LAB — LIPID PANEL
CHOLESTEROL: 221 mg/dL — AB (ref 0–200)
HDL: 48.6 mg/dL (ref >39.00)
LDL CALC: 136 mg/dL — AB (ref 0–99)
NonHDL: 172.61
TRIGLYCERIDES: 183 mg/dL — AB (ref 0.0–149.0)
Total CHOL/HDL Ratio: 5
VLDL: 36.6 mg/dL (ref 0.0–40.0)

## 2016-08-12 LAB — PSA: PSA: 1.1 ng/mL (ref 0.10–4.00)

## 2016-08-12 NOTE — Telephone Encounter (Signed)
Pt left v/m requesting refill of med to mail order pharmacy. Need name of med and pharmacy. Left v/m requesting pt to cb.

## 2016-08-13 ENCOUNTER — Other Ambulatory Visit: Payer: Self-pay | Admitting: Family Medicine

## 2016-08-13 ENCOUNTER — Other Ambulatory Visit: Payer: Self-pay | Admitting: *Deleted

## 2016-08-13 MED ORDER — SILDENAFIL CITRATE 20 MG PO TABS: 40.0000 mg | ORAL_TABLET | Freq: Every day | ORAL | 3 refills | Status: DC | PRN

## 2016-08-13 MED ORDER — ATORVASTATIN CALCIUM 40 MG PO TABS: 40.0000 mg | ORAL_TABLET | Freq: Every day | ORAL | 3 refills | Status: DC

## 2016-08-13 NOTE — Telephone Encounter (Signed)
Spoke with patient and info obtained.

## 2016-08-14 ENCOUNTER — Telehealth: Payer: Self-pay

## 2016-08-14 NOTE — Telephone Encounter (Signed)
Randy Decker with [facility] said that sildenafil 20 mg is not covered by pts ins; Prior Josem Kaufmann would not be effective. Left v/m per DPR for pt to ck with his ins co to see what substitute med would be more affordable and themn pt is to call [facility] with substitute information.

## 2016-08-15 ENCOUNTER — Telehealth: Payer: Self-pay | Admitting: *Deleted

## 2016-08-15 NOTE — Telephone Encounter (Signed)
PA for sildenafil in your IN box for completion.

## 2016-08-15 NOTE — Telephone Encounter (Signed)
PA not indicated. Pt aware this is an off label use and thus will not be covered by insurance. Pt will need to pay out of pocket. plz notify patient.

## 2016-08-18 NOTE — Telephone Encounter (Signed)
Spoke to pt and advised per Dr G 

## 2016-08-22 ENCOUNTER — Telehealth: Payer: Self-pay | Admitting: Pediatrics

## 2016-08-22 NOTE — Telephone Encounter (Signed)
PA for sildenafil was denied. Per Dr. Darnell Level, pt will have to pay out of pocket.   LMTCB

## 2016-08-25 NOTE — Telephone Encounter (Signed)
Pt returned call

## 2016-08-25 NOTE — Telephone Encounter (Signed)
Left message for Randy Decker to return my call.

## 2016-08-25 NOTE — Telephone Encounter (Signed)
Patient returned Misty's call.  Patient can be reached at [phone].

## 2016-08-25 NOTE — Telephone Encounter (Signed)
Randy Decker notified that his insurance will not cover sildenafil so he would need to payout of pocket for this medication if he wanted it.

## 2016-09-01 ENCOUNTER — Ambulatory Visit (HOSPITAL_COMMUNITY)
Admission: RE | Admit: 2016-09-01 | Discharge: 2016-09-01 | Disposition: A | Discharge status: Home / Self Care | Payer: BLUE CROSS/BLUE SHIELD | Source: Ambulatory Visit | Attending: Internal Medicine | Admitting: Internal Medicine

## 2016-09-01 DIAGNOSIS — Z136 Encounter for screening for cardiovascular disorders: Secondary | ICD-10-CM

## 2016-11-12 ENCOUNTER — Ambulatory Visit: Payer: BLUE CROSS/BLUE SHIELD | Admitting: Family Medicine

## 2017-01-28 DIAGNOSIS — Z23 Encounter for immunization: Secondary | ICD-10-CM | POA: Diagnosis not present

## 2017-03-20 ENCOUNTER — Other Ambulatory Visit: Payer: Self-pay | Admitting: Family Medicine

## 2017-09-02 ENCOUNTER — Encounter: Payer: BLUE CROSS/BLUE SHIELD | Admitting: Family Medicine

## 2017-09-22 ENCOUNTER — Ambulatory Visit (INDEPENDENT_AMBULATORY_CARE_PROVIDER_SITE_OTHER): Payer: BLUE CROSS/BLUE SHIELD | Admitting: Family Medicine

## 2017-09-22 ENCOUNTER — Encounter: Payer: Self-pay | Admitting: Family Medicine

## 2017-09-22 VITALS — BP 122/84 | HR 64 | Temp 97.9°F | Ht 67.25 in | Wt 219.5 lb

## 2017-09-22 DIAGNOSIS — M1A00X0 Idiopathic chronic gout, unspecified site, without tophus (tophi): Secondary | ICD-10-CM | POA: Diagnosis not present

## 2017-09-22 DIAGNOSIS — Z125 Encounter for screening for malignant neoplasm of prostate: Secondary | ICD-10-CM | POA: Diagnosis not present

## 2017-09-22 DIAGNOSIS — Z8249 Family history of ischemic heart disease and other diseases of the circulatory system: Secondary | ICD-10-CM | POA: Diagnosis not present

## 2017-09-22 DIAGNOSIS — E785 Hyperlipidemia, unspecified: Secondary | ICD-10-CM | POA: Diagnosis not present

## 2017-09-22 DIAGNOSIS — M109 Gout, unspecified: Secondary | ICD-10-CM | POA: Insufficient documentation

## 2017-09-22 DIAGNOSIS — E669 Obesity, unspecified: Secondary | ICD-10-CM

## 2017-09-22 DIAGNOSIS — Z0000 Encounter for general adult medical examination without abnormal findings: Secondary | ICD-10-CM | POA: Diagnosis not present

## 2017-09-22 LAB — BASIC METABOLIC PANEL
BUN: 15 mg/dL (ref 6–23)
CALCIUM: 9.2 mg/dL (ref 8.4–10.5)
CO2: 29 meq/L (ref 19–32)
CREATININE: 0.87 mg/dL (ref 0.40–1.50)
Chloride: 104 mEq/L (ref 96–112)
GFR: 94.33 mL/min (ref >60.00)
GLUCOSE: 97 mg/dL (ref 70–99)
Potassium: 4.4 mEq/L (ref 3.5–5.1)
Sodium: 141 mEq/L (ref 135–145)

## 2017-09-22 LAB — LIPID PANEL
CHOLESTEROL: 219 mg/dL — AB (ref 0–200)
HDL: 45.7 mg/dL (ref >39.00)
LDL CALC: 134 mg/dL — AB (ref 0–99)
NonHDL: 172.94
TRIGLYCERIDES: 193 mg/dL — AB (ref 0.0–149.0)
Total CHOL/HDL Ratio: 5
VLDL: 38.6 mg/dL (ref 0.0–40.0)

## 2017-09-22 LAB — PSA: PSA: 1.19 ng/mL (ref 0.10–4.00)

## 2017-09-22 LAB — TSH: TSH: 1.44 u[IU]/mL (ref 0.35–4.50)

## 2017-09-22 MED ORDER — COLCHICINE 0.6 MG PO CAPS: ORAL_CAPSULE | ORAL | 1 refills | Status: DC

## 2017-09-22 NOTE — Assessment & Plan Note (Signed)
Rare flares. Colchicine refilled.

## 2017-09-22 NOTE — Patient Instructions (Addendum)
Labs today. If interested, check with pharmacy about new 2 shot shingles series (shingrix).  Ok to stop aspirin.  Look for low cholesterol diet information.  EKG today.  You are doing well, return as needed or in 1 year for next physical  Health Maintenance, Male A healthy lifestyle and preventive care is important for your health and wellness. Ask your health care provider about what schedule of regular examinations is right for you. What should I know about weight and diet? Eat a Healthy Diet  Eat plenty of vegetables, fruits, whole grains, low-fat dairy products, and lean protein.  Do not eat a lot of foods high in solid fats, added sugars, or salt.  Maintain a Healthy Weight Regular exercise can help you achieve or maintain a healthy weight. You should:  Do at least 150 minutes of exercise each week. The exercise should increase your heart rate and make you sweat (moderate-intensity exercise).  Do strength-training exercises at least twice a week.  Watch Your Levels of Cholesterol and Blood Lipids  Have your blood tested for lipids and cholesterol every 5 years starting at 63 years of age. If you are at high risk for heart disease, you should start having your blood tested when you are 63 years old. You may need to have your cholesterol levels checked more often if: ? Your lipid or cholesterol levels are high. ? You are older than 63 years of age. ? You are at high risk for heart disease.  What should I know about cancer screening? Many types of cancers can be detected early and may often be prevented. Lung Cancer  You should be screened every year for lung cancer if: ? You are a current smoker who has smoked for at least 30 years. ? You are a former smoker who has quit within the past 15 years.  Talk to your health care provider about your screening options, when you should start screening, and how often you should be screened.  Colorectal Cancer  Routine colorectal  cancer screening usually begins at 63 years of age and should be repeated every 5-10 years until you are 63 years old. You may need to be screened more often if early forms of precancerous polyps or small growths are found. Your health care provider may recommend screening at an earlier age if you have risk factors for colon cancer.  Your health care provider may recommend using home test kits to check for hidden blood in the stool.  A small camera at the end of a tube can be used to examine your colon (sigmoidoscopy or colonoscopy). This checks for the earliest forms of colorectal cancer.  Prostate and Testicular Cancer  Depending on your age and overall health, your health care provider may do certain tests to screen for prostate and testicular cancer.  Talk to your health care provider about any symptoms or concerns you have about testicular or prostate cancer.  Skin Cancer  Check your skin from head to toe regularly.  Tell your health care provider about any new moles or changes in moles, especially if: ? There is a change in a mole's size, shape, or color. ? You have a mole that is larger than a pencil eraser.  Always use sunscreen. Apply sunscreen liberally and repeat throughout the day.  Protect yourself by wearing long sleeves, pants, a wide-brimmed hat, and sunglasses when outside.  What should I know about heart disease, diabetes, and high blood pressure?  If you are 18-39 years  of age, have your blood pressure checked every 3-5 years. If you are 41 years of age or older, have your blood pressure checked every year. You should have your blood pressure measured twice-once when you are at a hospital or clinic, and once when you are not at a hospital or clinic. Record the average of the two measurements. To check your blood pressure when you are not at a hospital or clinic, you can use: ? An automated blood pressure machine at a pharmacy. ? A home blood pressure monitor.  Talk to  your health care provider about your target blood pressure.  If you are between 66-65 years old, ask your health care provider if you should take aspirin to prevent heart disease.  Have regular diabetes screenings by checking your fasting blood sugar level. ? If you are at a normal weight and have a low risk for diabetes, have this test once every three years after the age of 20. ? If you are overweight and have a high risk for diabetes, consider being tested at a younger age or more often.  A one-time screening for abdominal aortic aneurysm (AAA) by ultrasound is recommended for men aged 43-75 years who are current or former smokers. What should I know about preventing infection? Hepatitis B If you have a higher risk for hepatitis B, you should be screened for this virus. Talk with your health care provider to find out if you are at risk for hepatitis B infection. Hepatitis C Blood testing is recommended for:  Everyone born from [birth year] through 1966.  Anyone with known risk factors for hepatitis C.  Sexually Transmitted Diseases (STDs)  You should be screened each year for STDs including gonorrhea and chlamydia if: ? You are sexually active and are younger than 63 years of age. ? You are older than 63 years of age and your health care provider tells you that you are at risk for this type of infection. ? Your sexual activity has changed since you were last screened and you are at an increased risk for chlamydia or gonorrhea. Ask your health care provider if you are at risk.  Talk with your health care provider about whether you are at high risk of being infected with HIV. Your health care provider may recommend a prescription medicine to help prevent HIV infection.  What else can I do?  Schedule regular health, dental, and eye exams.  Stay current with your vaccines (immunizations).  Do not use any tobacco products, such as cigarettes, chewing tobacco, and e-cigarettes. If you need  help quitting, ask your health care provider.  Limit alcohol intake to no more than 2 drinks per day. One drink equals 12 ounces of beer, 5 ounces of wine, or 1 ounces of hard liquor.  Do not use street drugs.  Do not share needles.  Ask your health care provider for help if you need support or information about quitting drugs.  Tell your health care provider if you often feel depressed.  Tell your health care provider if you have ever been abused or do not feel safe at home. This information is not intended to replace advice given to you by your health care provider. Make sure you discuss any questions you have with your health care provider. Document Released: 10/04/2007 Document Revised: 12/05/2015 Document Reviewed: 01/09/2015 Elsevier Interactive Patient Education  [employer].

## 2017-09-22 NOTE — Assessment & Plan Note (Signed)
Normal vascular screening studies done 08/2016

## 2017-09-22 NOTE — Assessment & Plan Note (Signed)
Preventative protocols reviewed and updated unless pt declined. Discussed healthy diet and lifestyle.  

## 2017-09-22 NOTE — Progress Notes (Addendum)
BP 122/84 (BP Location: Left Arm, Patient Position: Sitting, Cuff Size: Normal)   Pulse 64   Temp 97.9 F (36.6 C) (Oral)   Ht 5' 7.25" (1.708 m)   Wt 219 lb 8 oz (99.6 kg)   SpO2 96%   BMI 34.12 kg/m    CC: CPE Subjective:    Patient ID: Randy Decker, male    DOB: [birth year], 62 y.o.   MRN: [id number]  HPI: Randy Decker is a 62 y.o. male presenting on 09/22/2017 for Annual Exam (Wants to discuss Lipitor, ASA and vit B 12.)   fmhx AAA (father) - pt had normal vascular screenings 08/2016.  Pt would like EKG. H/o HLD. Stopped lipitor 6 months ago. Some heat intolerance. If needed, would like to start different statin.   Preventative: COLONOSCOPY Date: 2015 WNL Carlean Purl) Prostate cancer screening - discussed, continue screening  Flu shot - yearly Tetanus - 2012 Hep B series 2012 zostavax 2016 shingrix - discussed Seat belt use discussed. Sunscreen use discussed. No changing moles on skin.  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 Interim medical history since our last visit reviewed. Allergies and medications reviewed and updated. Outpatient Medications Prior to Visit  Medication Sig Dispense Refill  . VIAGRA 100 MG tablet TAKE 0.5-1 TABLET BY MOUTH DAILY AS NEEDED FOR ERECTILE DYSFUNCTION (INS. COVERS 3 IN 31 DAYS) 9 tablet 3  . aspirin 81 MG tablet Take 81 mg by mouth daily.    . Colchicine (MITIGARE) 0.6 MG CAPS TAKE 2 CAPSULES BY MOUTH ON FIRST DAY THEN ONE DAILY AS NEEDED FOR GOUT FLARE 30 capsule 1  . atorvastatin (LIPITOR) 40 MG tablet Take 1 tablet (40 mg total) by mouth daily. (Patient not  taking: Reported on 09/22/2017) 90 tablet 3  . sildenafil (REVATIO) 20 MG tablet Take 2-5 tablets (40-100 mg total) by mouth daily as needed (relations). (Patient not taking: Reported on 09/22/2017) 90 tablet 3  . vitamin B-12 (CYANOCOBALAMIN) 500 MCG tablet Take 500 mcg by mouth daily.     No facility-administered medications prior to visit.      Per HPI unless specifically indicated in ROS section below Review of Systems  Constitutional: Negative for activity change, appetite change, chills, fatigue, fever and unexpected weight change.  HENT: Negative for hearing loss.   Eyes: Negative for visual disturbance.  Respiratory: Negative for cough, chest tightness, shortness of breath and wheezing.   Cardiovascular: Negative for chest pain, palpitations and leg swelling.  Gastrointestinal: Negative for abdominal distention, abdominal pain, blood in stool, constipation, diarrhea, nausea and vomiting.  Genitourinary: Negative for difficulty urinating and hematuria.  Musculoskeletal: Negative for arthralgias, myalgias and neck pain.  Skin: Negative for rash.  Neurological: Negative for dizziness, seizures, syncope and headaches.  Hematological: Negative for adenopathy. Does not bruise/bleed easily.  Psychiatric/Behavioral: Negative for dysphoric mood. The patient is not nervous/anxious.        Objective:    BP 122/84 (BP Location: Left Arm, Patient Position: Sitting, Cuff Size: Normal)   Pulse 64   Temp 97.9 F (36.6 C) (Oral)   Ht 5' 7.25" (1.708 m)   Wt 219 lb  8 oz (99.6 kg)   SpO2 96%   BMI 34.12 kg/m   Wt Readings from Last 3 Encounters:  09/22/17 219 lb 8 oz (99.6 kg)  08/11/16 224 lb 8 oz (101.8 kg)  08/07/15 207 lb 6.4 oz (94.1 kg)    Physical Exam  Constitutional: He is oriented to person, place, and time. He appears well-developed and well-nourished. No distress.  HENT:  Head: Normocephalic and atraumatic.  Right Ear: Hearing, tympanic membrane, external ear and ear canal  normal.  Left Ear: Hearing, tympanic membrane, external ear and ear canal normal.  Nose: Nose normal.  Mouth/Throat: Uvula is midline, oropharynx is clear and moist and mucous membranes are normal. No oropharyngeal exudate, posterior oropharyngeal edema or posterior oropharyngeal erythema.  Eyes: Pupils are equal, round, and reactive to light. Conjunctivae and EOM are normal. No scleral icterus.  Neck: Normal range of motion. Neck supple. Carotid bruit is not present. No thyromegaly present.  Cardiovascular: Normal rate, regular rhythm, normal heart sounds and intact distal pulses.  No murmur heard. Pulses:      Radial pulses are 2+ on the right side, and 2+ on the left side.  Pulmonary/Chest: Effort normal and breath sounds normal. No respiratory distress. He has no wheezes. He has no rales.  Abdominal: Soft. Bowel sounds are normal. He exhibits no distension and no mass. There is no tenderness. There is no rebound and no guarding.  Genitourinary: Prostate normal. Rectal exam shows external hemorrhoid (noninflamed). Rectal exam shows no internal hemorrhoid, no fissure, no mass, no tenderness and anal tone normal. Prostate is not enlarged (15gm) and not tender.  Musculoskeletal: Normal range of motion. He exhibits no edema.  Lymphadenopathy:    He has no cervical adenopathy.  Neurological: He is alert and oriented to person, place, and time.  CN grossly intact, station and gait intact  Skin: Skin is warm and dry. No rash noted.  Psychiatric: He has a normal mood and affect. His behavior is normal. Judgment and thought content normal.  Nursing note and vitals reviewed.  Results for orders placed or performed in visit on 08/11/16  Lipid panel  Result Value Ref Range   Cholesterol 221 (H) 0 - 200 mg/dL   Triglycerides 183.0 (H) 0.0 - 149.0 mg/dL   HDL 48.60 >39.00 mg/dL   VLDL 36.6 0.0 - 40.0 mg/dL   LDL Cholesterol 136 (H) 0 - 99 mg/dL   Total CHOL/HDL Ratio 5    NonHDL 172.61   PSA    Result Value Ref Range   PSA 1.10 0.10 - 4.00 ng/mL  Basic metabolic panel  Result Value Ref Range   Sodium 140 135 - 145 mEq/L   Potassium 4.0 3.5 - 5.1 mEq/L   Chloride 104 96 - 112 mEq/L   CO2 28 19 - 32 mEq/L   Glucose, Bld 89 70 - 99 mg/dL   BUN 11 6 - 23 mg/dL   Creatinine, Ser 0.92 0.40 - 1.50 mg/dL   Calcium 9.3 8.4 - 10.5 mg/dL   GFR 88.76 >60.00 mL/min   EKG - sinus bradycardia, normal axis, intervals, no acute ST/T changes.     Assessment & Plan:   Problem List Items Addressed This Visit    Family history of abdominal aortic aneurysm (AAA)    Normal vascular screening studies done 08/2016      Gout    Rare flares. Colchicine refilled.      Healthcare maintenance - Primary    Preventative protocols reviewed and updated unless  pt declined. Discussed healthy diet and lifestyle.       HLD (hyperlipidemia)    Stopped statin 6 months ago - ?heat intolerance while on statin. Baseline EKG today.  The 10-year ASCVD risk score Mikey Bussing DC Brooke Bonito., et al., 2013) is: 10.3%   Values used to calculate the score:     Age: 44 years     Sex: Male     Is Non-Hispanic African American: No     Diabetic: No     Tobacco smoker: No     Systolic Blood Pressure: 818 mmHg     Is BP treated: No     HDL Cholesterol: 48.6 mg/dL     Total Cholesterol: 221 mg/dL       Relevant Orders   EKG 12-Lead   Lipid panel   TSH   Basic metabolic panel   Obesity, Class I, BMI 30-34.9    Encouraged healthy diet and lifestyle changes to affect sustainable weight loss.        Other Visit Diagnoses    Special screening for malignant neoplasm of prostate       Relevant Orders   PSA       Meds ordered this encounter  Medications  . Colchicine (MITIGARE) 0.6 MG CAPS    Sig: TAKE 2 CAPSULES BY MOUTH ON FIRST DAY THEN ONE DAILY AS NEEDED FOR GOUT FLARE    Dispense:  30 capsule    Refill:  1   Orders Placed This Encounter  Procedures  . Lipid panel  . TSH  . PSA  . Basic metabolic panel   . EKG 12-Lead    Follow up plan: Return in about 1 year (around 09/23/2018) for annual exam, prior fasting for blood work.  Ria Bush, MD

## 2017-09-22 NOTE — Assessment & Plan Note (Signed)
Encouraged healthy diet and lifestyle changes to affect sustainable weight loss.  

## 2017-09-22 NOTE — Assessment & Plan Note (Addendum)
Stopped statin 6 months ago - ?heat intolerance while on statin. Baseline EKG today.  The 10-year ASCVD risk score Mikey Bussing DC Brooke Bonito., et al., 2013) is: 10.3%   Values used to calculate the score:     Age: 62 years     Sex: Male     Is Non-Hispanic African American: No     Diabetic: No     Tobacco smoker: No     Systolic Blood Pressure: 951 mmHg     Is BP treated: No     HDL Cholesterol: 48.6 mg/dL     Total Cholesterol: 221 mg/dL

## 2017-09-26 ENCOUNTER — Other Ambulatory Visit: Payer: Self-pay | Admitting: Family Medicine

## 2017-09-26 MED ORDER — LOVASTATIN 40 MG PO TABS: 40.0000 mg | ORAL_TABLET | Freq: Every day | ORAL | 3 refills | Status: DC

## 2017-10-12 ENCOUNTER — Other Ambulatory Visit: Payer: Self-pay | Admitting: Family Medicine

## 2018-01-20 DIAGNOSIS — Z23 Encounter for immunization: Secondary | ICD-10-CM | POA: Diagnosis not present

## 2018-03-11 ENCOUNTER — Other Ambulatory Visit: Payer: Self-pay | Admitting: Family Medicine

## 2018-05-08 ENCOUNTER — Other Ambulatory Visit: Payer: Self-pay | Admitting: Family Medicine

## 2018-06-08 ENCOUNTER — Other Ambulatory Visit: Payer: Self-pay | Admitting: Family Medicine

## 2018-07-06 ENCOUNTER — Other Ambulatory Visit: Payer: Self-pay | Admitting: Family Medicine

## 2018-07-21 ENCOUNTER — Other Ambulatory Visit: Payer: Self-pay | Admitting: Family Medicine

## 2018-09-25 ENCOUNTER — Other Ambulatory Visit: Payer: Self-pay | Admitting: Family Medicine

## 2018-09-28 ENCOUNTER — Other Ambulatory Visit: Payer: Self-pay | Admitting: Family Medicine

## 2018-09-28 DIAGNOSIS — Z125 Encounter for screening for malignant neoplasm of prostate: Secondary | ICD-10-CM

## 2018-09-28 DIAGNOSIS — E785 Hyperlipidemia, unspecified: Secondary | ICD-10-CM

## 2018-09-28 DIAGNOSIS — M1A00X0 Idiopathic chronic gout, unspecified site, without tophus (tophi): Secondary | ICD-10-CM

## 2018-09-29 ENCOUNTER — Ambulatory Visit (INDEPENDENT_AMBULATORY_CARE_PROVIDER_SITE_OTHER): Payer: BC Managed Care – PPO | Admitting: Family Medicine

## 2018-09-29 ENCOUNTER — Encounter: Payer: Self-pay | Admitting: Family Medicine

## 2018-09-29 ENCOUNTER — Other Ambulatory Visit (INDEPENDENT_AMBULATORY_CARE_PROVIDER_SITE_OTHER): Payer: BC Managed Care – PPO

## 2018-09-29 VITALS — BP 133/86 | HR 73 | Temp 97.5°F | Ht 67.25 in

## 2018-09-29 DIAGNOSIS — E785 Hyperlipidemia, unspecified: Secondary | ICD-10-CM

## 2018-09-29 DIAGNOSIS — M1A00X0 Idiopathic chronic gout, unspecified site, without tophus (tophi): Secondary | ICD-10-CM

## 2018-09-29 DIAGNOSIS — Z125 Encounter for screening for malignant neoplasm of prostate: Secondary | ICD-10-CM

## 2018-09-29 DIAGNOSIS — Z0000 Encounter for general adult medical examination without abnormal findings: Secondary | ICD-10-CM

## 2018-09-29 DIAGNOSIS — R030 Elevated blood-pressure reading, without diagnosis of hypertension: Secondary | ICD-10-CM

## 2018-09-29 DIAGNOSIS — E669 Obesity, unspecified: Secondary | ICD-10-CM

## 2018-09-29 MED ORDER — SILDENAFIL CITRATE 100 MG PO TABS: ORAL_TABLET | ORAL | 11 refills | Status: DC

## 2018-09-29 MED ORDER — LOVASTATIN 40 MG PO TABS: 40.0000 mg | ORAL_TABLET | Freq: Every day | ORAL | 3 refills | Status: DC

## 2018-09-29 MED ORDER — COLCHICINE 0.6 MG PO CAPS: ORAL_CAPSULE | ORAL | 2 refills | Status: DC

## 2018-09-29 NOTE — Progress Notes (Signed)
Virtual visit completed through [email or web address]. Due to national recommendations of social distancing due to COVID-19, a virtual visit is felt to be most appropriate for this patient at this time. Reviewed limitations of a virtual visit.   Patient location: home Provider location: [place] at [facility], office If any vitals were documented, they were collected by patient at home unless specified below.    BP 133/86   Pulse 73   Temp (!) 97.5 F (36.4 C) (Oral)   Ht 5' 7.25" (1.708 m)   BMI 34.12 kg/m    CC: CPE Subjective:    Patient ID: Randy Decker, male    DOB: Sep 21, 1955, 63 y.o.   MRN: [id number]  HPI: Randy Decker is a 63 y.o. 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   Fmhx AAA (father) - pt had normal vascular screenings 08/2016.   Noticing increasing soreness, attributes to age.   Preventative: COLONOSCOPY Date: 2015 WNL Carlean Purl) Prostate cancer screening - discussed, continue screening  Flu shot - yearly Tetanus - 2012 Hep B series 2012 zostavax 2016 shingrix - 06/2018, rpt due  Seat belt use discussed.  Sunscreen use discussed.No changing moles on skin.  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 Interim medical history since our last visit reviewed. Allergies and medications reviewed and updated. Outpatient Medications Prior to Visit  Medication Sig Dispense Refill  . lovastatin (MEVACOR) 40 MG tablet TAKE 1 TABLET BY MOUTH EVERYDAY AT BEDTIME 90 tablet 0  . MITIGARE 0.6 MG CAPS TAKE 2  CAPSULES BY MOUTH ON FIRST DAY THEN ONE DAILY AS NEEDED FOR GOUT FLARE 30 capsule 2  . sildenafil (VIAGRA) 100 MG tablet TAKE 0.5-1 TABLET BY MOUTH DAILY AS NEEDED FOR ERECTILE DYSFUNCTION (INS. COVERS 3 IN 31 DAYS) 3 tablet 7   No facility-administered medications prior to visit.      Per HPI unless specifically indicated in ROS section below Review of Systems  Constitutional: Negative for activity change, appetite change, chills, fatigue, fever and unexpected weight change.  HENT: Negative for hearing loss.   Eyes: Negative for visual disturbance.  Respiratory: Negative for cough, chest tightness, shortness of breath and wheezing.   Cardiovascular: Negative for chest pain, palpitations and leg swelling.  Gastrointestinal: Negative for abdominal distention, abdominal pain, blood in stool, constipation, diarrhea, nausea and vomiting.  Genitourinary: Negative for difficulty urinating and hematuria.  Musculoskeletal: Negative for arthralgias, myalgias and neck pain.  Skin: Negative for rash.  Neurological: Negative for dizziness, seizures, syncope and headaches.  Hematological: Negative for adenopathy. Does not bruise/bleed easily.  Psychiatric/Behavioral: Negative for dysphoric mood. The patient is not nervous/anxious.    Objective:    BP 133/86   Pulse 73   Temp (!) 97.5 F (36.4 C) (Oral)   Ht 5' 7.25" (1.708 m)   BMI 34.12 kg/m   Wt Readings from Last 3 Encounters:  09/22/17 219 lb 8 oz (99.6 kg)  08/11/16 224 lb 8 oz (101.8 kg)  08/07/15 207 lb 6.4 oz (94.1 kg)     Physical exam:  Gen: alert, NAD, not ill appearing Pulm: speaks in complete sentences without increased work of breathing Psych: normal mood, normal thought content      Results for orders placed or performed in visit on 09/22/17  Lipid panel  Result Value Ref Range   Cholesterol 219 (H) 0 - 200 mg/dL   Triglycerides 193.0 (H) 0.0 - 149.0 mg/dL   HDL 45.70 >39.00 mg/dL   VLDL 38.6 0.0 - 40.0 mg/dL   LDL  Cholesterol 134 (H) 0 - 99 mg/dL   Total CHOL/HDL Ratio 5    NonHDL 172.94   TSH  Result Value Ref Range   TSH 1.44 0.35 - 4.50 uIU/mL  PSA  Result Value Ref Range   PSA 1.19 0.10 - 4.00 ng/mL  Basic metabolic panel  Result Value Ref Range   Sodium 141 135 - 145 mEq/L   Potassium 4.4 3.5 - 5.1 mEq/L   Chloride 104 96 - 112 mEq/L   CO2 29 19 - 32 mEq/L   Glucose, Bld 97 70 - 99 mg/dL   BUN 15 6 - 23 mg/dL   Creatinine, Ser 0.87 0.40 - 1.50 mg/dL   Calcium 9.2 8.4 - 10.5 mg/dL   GFR 94.33 >60.00 mL/min   Assessment & Plan:   Problem List Items Addressed This Visit    Obesity, Class I, BMI 30-34.9    Discussed healthy diet and lifestyle choices for sustainable weight loss.       HLD (hyperlipidemia)    Chronic, stable. Tolerating lovastatin well. FLP pending. The 10-year ASCVD risk score Mikey Bussing DC Brooke Bonito., et al., 2013) is: 13.2%   Values used to calculate the score:     Age: 50 years     Sex: Male     Is Non-Hispanic African American: No     Diabetic: No     Tobacco smoker: No     Systolic Blood Pressure: 616 mmHg     Is BP treated: No     HDL Cholesterol: 45.7 mg/dL     Total Cholesterol: 219 mg/dL       Relevant Medications   sildenafil (VIAGRA) 100 MG tablet   lovastatin (MEVACOR) 40 MG tablet   Healthcare maintenance - Primary    Preventative protocols reviewed and updated unless pt declined. Discussed healthy diet and lifestyle.       Gout    Rare flare. Colchicine refilled.       Elevated blood pressure reading without diagnosis of hypertension    Stable readings recently - will continue to monitor. Has home cuff he can use.           Meds ordered this encounter  Medications  . Colchicine (MITIGARE) 0.6 MG CAPS    Sig: TAKE 2 CAPSULES BY MOUTH ON FIRST DAY THEN ONE DAILY AS NEEDED FOR GOUT FLARE    Dispense:  30 capsule    Refill:  2  . sildenafil (VIAGRA) 100 MG tablet    Sig: TAKE 0.5-1 TABLET BY MOUTH DAILY AS NEEDED FOR ERECTILE DYSFUNCTION  (INS. COVERS 3 IN 31 DAYS)    Dispense:  3 tablet    Refill:  11  . lovastatin (MEVACOR) 40 MG tablet    Sig: Take 1 tablet (40 mg total) by mouth at bedtime.    Dispense:  90 tablet    Refill:  3   No orders of the defined types were placed in this encounter.   I discussed the assessment and treatment plan with the patient. The patient  was provided an opportunity to ask questions and all were answered. The patient agreed with the plan and demonstrated an understanding of the instructions. The patient was advised to call back or seek an in-person evaluation if the symptoms worsen or if the condition fails to improve as anticipated.  Follow up plan: Return in about 1 year (around 09/29/2019).  Ria Bush, MD

## 2018-09-29 NOTE — Assessment & Plan Note (Signed)
Rare flare. Colchicine refilled.

## 2018-09-29 NOTE — Assessment & Plan Note (Signed)
Stable readings recently - will continue to monitor. Has home cuff he can use.

## 2018-09-29 NOTE — Assessment & Plan Note (Signed)
Chronic, stable. Tolerating lovastatin well. FLP pending. The 10-year ASCVD risk score Mikey Bussing DC Brooke Bonito., et al., 2013) is: 13.2%   Values used to calculate the score:     Age: 63 years     Sex: Male     Is Non-Hispanic African American: No     Diabetic: No     Tobacco smoker: No     Systolic Blood Pressure: 383 mmHg     Is BP treated: No     HDL Cholesterol: 45.7 mg/dL     Total Cholesterol: 219 mg/dL

## 2018-09-29 NOTE — Assessment & Plan Note (Signed)
Discussed healthy diet and lifestyle choices for sustainable weight loss.  ?

## 2018-09-29 NOTE — Assessment & Plan Note (Signed)
Preventative protocols reviewed and updated unless pt declined. Discussed healthy diet and lifestyle.  

## 2018-09-30 LAB — COMPREHENSIVE METABOLIC PANEL
ALT: 14 U/L (ref 0–53)
AST: 12 U/L (ref 0–37)
Albumin: 4.1 g/dL (ref 3.5–5.2)
Alkaline Phosphatase: 49 U/L (ref 39–117)
BUN: 16 mg/dL (ref 6–23)
CO2: 24 mEq/L (ref 19–32)
Calcium: 8.8 mg/dL (ref 8.4–10.5)
Chloride: 106 mEq/L (ref 96–112)
Creatinine, Ser: 0.98 mg/dL (ref 0.40–1.50)
GFR: 77.1 mL/min (ref >60.00)
Glucose, Bld: 88 mg/dL (ref 70–99)
Potassium: 4 mEq/L (ref 3.5–5.1)
Sodium: 140 mEq/L (ref 135–145)
Total Bilirubin: 0.7 mg/dL (ref 0.2–1.2)
Total Protein: 6.5 g/dL (ref 6.0–8.3)

## 2018-09-30 LAB — LIPID PANEL
Cholesterol: 179 mg/dL (ref 0–200)
HDL: 39.6 mg/dL (ref >39.00)
LDL Cholesterol: 110 mg/dL — ABNORMAL HIGH (ref 0–99)
NonHDL: 139.32
Total CHOL/HDL Ratio: 5
Triglycerides: 148 mg/dL (ref 0.0–149.0)
VLDL: 29.6 mg/dL (ref 0.0–40.0)

## 2018-09-30 LAB — URIC ACID: Uric Acid, Serum: 9.5 mg/dL — ABNORMAL HIGH (ref 4.0–7.8)

## 2018-09-30 LAB — PSA: PSA: 1.34 ng/mL (ref 0.10–4.00)

## 2018-12-14 ENCOUNTER — Other Ambulatory Visit: Payer: Self-pay | Admitting: Family Medicine

## 2018-12-15 NOTE — Telephone Encounter (Signed)
Mitigare Last filled:  09/29/18, #90 Last OV:  09/29/18, CPE Next OV:  10/05/19, CPE

## 2019-10-05 ENCOUNTER — Other Ambulatory Visit: Payer: Self-pay

## 2019-10-05 ENCOUNTER — Ambulatory Visit (INDEPENDENT_AMBULATORY_CARE_PROVIDER_SITE_OTHER): Payer: BC Managed Care – PPO | Admitting: Family Medicine

## 2019-10-05 ENCOUNTER — Encounter: Payer: Self-pay | Admitting: Family Medicine

## 2019-10-05 ENCOUNTER — Telehealth: Payer: Self-pay | Admitting: Family Medicine

## 2019-10-05 VITALS — BP 154/98 | HR 68 | Temp 97.8°F | Ht 67.5 in | Wt 225.5 lb

## 2019-10-05 DIAGNOSIS — Z0001 Encounter for general adult medical examination with abnormal findings: Secondary | ICD-10-CM

## 2019-10-05 DIAGNOSIS — R1013 Epigastric pain: Secondary | ICD-10-CM | POA: Insufficient documentation

## 2019-10-05 DIAGNOSIS — I10 Essential (primary) hypertension: Secondary | ICD-10-CM | POA: Diagnosis not present

## 2019-10-05 DIAGNOSIS — E785 Hyperlipidemia, unspecified: Secondary | ICD-10-CM | POA: Diagnosis not present

## 2019-10-05 DIAGNOSIS — M1A00X0 Idiopathic chronic gout, unspecified site, without tophus (tophi): Secondary | ICD-10-CM

## 2019-10-05 DIAGNOSIS — Z125 Encounter for screening for malignant neoplasm of prostate: Secondary | ICD-10-CM

## 2019-10-05 DIAGNOSIS — K8020 Calculus of gallbladder without cholecystitis without obstruction: Secondary | ICD-10-CM | POA: Insufficient documentation

## 2019-10-05 DIAGNOSIS — N529 Male erectile dysfunction, unspecified: Secondary | ICD-10-CM

## 2019-10-05 DIAGNOSIS — E669 Obesity, unspecified: Secondary | ICD-10-CM

## 2019-10-05 LAB — COMPREHENSIVE METABOLIC PANEL
ALT: 18 U/L (ref 0–53)
AST: 17 U/L (ref 0–37)
Albumin: 4.3 g/dL (ref 3.5–5.2)
Alkaline Phosphatase: 48 U/L (ref 39–117)
BUN: 14 mg/dL (ref 6–23)
CO2: 29 mEq/L (ref 19–32)
Calcium: 8.7 mg/dL (ref 8.4–10.5)
Chloride: 104 mEq/L (ref 96–112)
Creatinine, Ser: 0.88 mg/dL (ref 0.40–1.50)
GFR: 87.02 mL/min (ref >60.00)
Glucose, Bld: 85 mg/dL (ref 70–99)
Potassium: 4.4 mEq/L (ref 3.5–5.1)
Sodium: 140 mEq/L (ref 135–145)
Total Bilirubin: 0.7 mg/dL (ref 0.2–1.2)
Total Protein: 6.7 g/dL (ref 6.0–8.3)

## 2019-10-05 LAB — LIPID PANEL
Cholesterol: 201 mg/dL — ABNORMAL HIGH (ref 0–200)
HDL: 44.8 mg/dL (ref >39.00)
LDL Cholesterol: 140 mg/dL — ABNORMAL HIGH (ref 0–99)
NonHDL: 156.21
Total CHOL/HDL Ratio: 4
Triglycerides: 79 mg/dL (ref 0.0–149.0)
VLDL: 15.8 mg/dL (ref 0.0–40.0)

## 2019-10-05 LAB — PSA: PSA: 0.89 ng/mL (ref 0.10–4.00)

## 2019-10-05 LAB — MICROALBUMIN / CREATININE URINE RATIO
Creatinine,U: 97.5 mg/dL
Microalb Creat Ratio: 0.7 mg/g (ref 0.0–30.0)
Microalb, Ur: <0.7 mg/dL (ref 0.0–1.9)

## 2019-10-05 LAB — LIPASE: Lipase: 37 U/L (ref 11.0–59.0)

## 2019-10-05 LAB — TSH: TSH: 1.15 u[IU]/mL (ref 0.35–4.50)

## 2019-10-05 LAB — URIC ACID: Uric Acid, Serum: 7.7 mg/dL (ref 4.0–7.8)

## 2019-10-05 MED ORDER — AMLODIPINE BESYLATE 5 MG PO TABS: 5.0000 mg | ORAL_TABLET | Freq: Every day | ORAL | 3 refills | Status: DC

## 2019-10-05 MED ORDER — COLCHICINE 0.6 MG PO CAPS: ORAL_CAPSULE | ORAL | 0 refills | Status: DC

## 2019-10-05 MED ORDER — LOVASTATIN 40 MG PO TABS: 40.0000 mg | ORAL_TABLET | Freq: Every day | ORAL | 3 refills | Status: DC

## 2019-10-05 MED ORDER — ALLOPURINOL 100 MG PO TABS: 100.0000 mg | ORAL_TABLET | Freq: Every day | ORAL | 3 refills | Status: DC

## 2019-10-05 MED ORDER — SILDENAFIL CITRATE 100 MG PO TABS: ORAL_TABLET | ORAL | 11 refills | Status: DC

## 2019-10-05 NOTE — Assessment & Plan Note (Addendum)
New. Of unclear cause. Doesn't sound cardiac, not consistent with GERD. Check baseline EKG. Update if recurrent.

## 2019-10-05 NOTE — Assessment & Plan Note (Signed)
Preventative protocols reviewed and updated unless pt declined. Discussed healthy diet and lifestyle.  

## 2019-10-05 NOTE — Assessment & Plan Note (Addendum)
Chronic, stable on lovastatin - continue statin, update FLP.  The 10-year ASCVD risk score Mikey Bussing DC Brooke Bonito., et al., 2013) is: 19.9%   Values used to calculate the score:     Age: 64 years     Sex: Male     Is Non-Hispanic African American: No     Diabetic: No     Tobacco smoker: No     Systolic Blood Pressure: 542 mmHg     Is BP treated: Yes     HDL Cholesterol: 44.8 mg/dL     Total Cholesterol: 201 mg/dL

## 2019-10-05 NOTE — Patient Instructions (Addendum)
Labs today Start allopurinol 100mg  daily - daily gout lowering medicine. Continue mitigare as needed when you get a flare.  Blood pressure is staying elevated - start amlodipine 5mg  daily  Return 04/2020 for welcome to medicare visit.  Check with [facility] to ensure you got both shingles shots, and send me dates.    Health Maintenance, Male Adopting a healthy lifestyle and getting preventive care are important in promoting health and wellness. Ask your health care provider about:  The right schedule for you to have regular tests and exams.  Things you can do on your own to prevent diseases and keep yourself healthy. What should I know about diet, weight, and exercise? Eat a healthy diet   Eat a diet that includes plenty of vegetables, fruits, low-fat dairy products, and lean protein.  Do not eat a lot of foods that are high in solid fats, added sugars, or sodium. Maintain a healthy weight Body mass index (BMI) is a measurement that can be used to identify possible weight problems. It estimates body fat based on height and weight. Your health care provider can help determine your BMI and help you achieve or maintain a healthy weight. Get regular exercise Get regular exercise. This is one of the most important things you can do for your health. Most adults should:  Exercise for at least 150 minutes each week. The exercise should increase your heart rate and make you sweat (moderate-intensity exercise).  Do strengthening exercises at least twice a week. This is in addition to the moderate-intensity exercise.  Spend less time sitting. Even light physical activity can be beneficial. Watch cholesterol and blood lipids Have your blood tested for lipids and cholesterol at 65 years of age, then have this test every 5 years. You may need to have your cholesterol levels checked more often if:  Your lipid or cholesterol levels are high.  You are older than 64 years of age.  You are at high risk for  heart disease. What should I know about cancer screening? Many types of cancers can be detected early and may often be prevented. Depending on your health history and family history, you may need to have cancer screening at various ages. This may include screening for:  Colorectal cancer.  Prostate cancer.  Skin cancer.  Lung cancer. What should I know about heart disease, diabetes, and high blood pressure? Blood pressure and heart disease  High blood pressure causes heart disease and increases the risk of stroke. This is more likely to develop in people who have high blood pressure readings, are of African descent, or are overweight.  Talk with your health care provider about your target blood pressure readings.  Have your blood pressure checked: ? Every 3-5 years if you are 13-70 years of age. ? Every year if you are 40 years old or older.  If you are between the ages of 28 and 5 and are a current or former smoker, ask your health care provider if you should have a one-time screening for abdominal aortic aneurysm (AAA). Diabetes Have regular diabetes screenings. This checks your fasting blood sugar level. Have the screening done:  Once every three years after age 99 if you are at a normal weight and have a low risk for diabetes.  More often and at a younger age if you are overweight or have a high risk for diabetes. What should I know about preventing infection? Hepatitis B If you have a higher risk for hepatitis B, you should  this virus. Talk with your health care provider to find out if you are at risk for hepatitis B infection. Hepatitis C Blood testing is recommended for:  Everyone born from 1945 through 1965.  Anyone with known risk factors for hepatitis C. Sexually transmitted infections (STIs)  You should be screened each year for STIs, including gonorrhea and chlamydia, if: ? You are sexually active and are younger than 65 years of age. ? You are  older than 65 years of age and your health care provider tells you that you are at risk for this type of infection. ? Your sexual activity has changed since you were last screened, and you are at increased risk for chlamydia or gonorrhea. Ask your health care provider if you are at risk.  Ask your health care provider about whether you are at high risk for HIV. Your health care provider may recommend a prescription medicine to help prevent HIV infection. If you choose to take medicine to prevent HIV, you should first get tested for HIV. You should then be tested every 3 months for as long as you are taking the medicine. Follow these instructions at home: Lifestyle  Do not use any products that contain nicotine or tobacco, such as cigarettes, e-cigarettes, and chewing tobacco. If you need help quitting, ask your health care provider.  Do not use street drugs.  Do not share needles.  Ask your health care provider for help if you need support or information about quitting drugs. Alcohol use  Do not drink alcohol if your health care provider tells you not to drink.  If you drink alcohol: ? Limit how much you have to 0-2 drinks a day. ? Be aware of how much alcohol is in your drink. In the [place] one drink equals one 12 oz bottle of beer (355 mL), one 5 oz glass of wine (148 mL), or one 1 oz glass of hard liquor (44 mL). General instructions  Schedule regular health, dental, and eye exams.  Stay current with your vaccines.  Tell your health care provider if: ? You often feel depressed. ? You have ever been abused or do not feel safe at home. Summary  Adopting a healthy lifestyle and getting preventive care are important in promoting health and wellness.  Follow your health care provider's instructions about healthy diet, exercising, and getting tested or screened for diseases.  Follow your health care provider's instructions on monitoring your cholesterol and blood pressure. This  information is not intended to replace advice given to you by your health care provider. Make sure you discuss any questions you have with your health care provider. Document Revised: 03/31/2018 Document Reviewed: 03/31/2018 Elsevier Patient Education  2020 [employer]  

## 2019-10-05 NOTE — Assessment & Plan Note (Signed)
Weight gain noted. Encouraged healthy diet and lifestyle changes to affect sustainable weight loss.

## 2019-10-05 NOTE — Progress Notes (Signed)
This visit was conducted in person.  BP (!) 154/98 (BP Location: Right Arm, Patient Position: Sitting, Cuff Size: Normal)   Pulse 68   Temp 97.8 F (36.6 C) (Temporal)   Ht 5' 7.5" (1.715 m)   Wt 225 lb 8 oz (102.3 kg)   SpO2 97%   BMI 34.80 kg/m   On recheck bp 160/100 CC: CPE Subjective:    Patient ID: Randy Decker, male    DOB: 01-Dec-1955, 64 y.o.   MRN: [id number]  HPI: Randy Decker is a 64 y.o. male presenting on 10/05/2019 for Annual Exam   BP elevated in office, elevated at home as well over last few weeks (130-150/80-90s). Prior on amlodipine but he was previously able to taper off.   Treadmill broke. Continues walking 1 hour 4-5x/wk.   Several gout flares over the past year about every 2 months. Previously managed with colchicine PRN. Noticing some pain and swelling at bilateral olecranons.    Notes pain to L anterior leg when he awakens. Not related to activity/walking. No skin changes.   Monday while driving felt epigastric pain described as constant pain associated with diaphoresis. Drank some water and pain resolved. Not indigestion related. No boring pain to back. No pressure/tightness or dyspnea.   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 Planning to retire when he turns 64yo later this year.   [facility] (father) - pt had normal vascular screenings 08/2016.  Preventative: COLONOSCOPY Date: 2015 WNL Carlean Purl)  Prostate cancer screening - discussed, continue screening, no BPH sxs  Flu shot - yearly  Tetanus - 2012 COVID vaccine - completed [place] vaccine 06/2019 Hep B series 2012 zostavax 2016 shingrix - 06/2018, thinks completed at [facility] - will check with them Seat belt use discussed.  Sunscreen use discussed.No changing moles on skin.  Ex smoker - quit 1985  Alcohol - 6 pack a week  Dentist - Q6 months  Eye exam - every other year   Lives alone, 2 cats and 1 dog  Occupation: maintenance Press photographer) - furloughed Edu: College Activity: walking Diet: some water,drinks sugar free powerade,fruits/vegetables regularly,rare meat     Relevant past medical, surgical, family and social history reviewed and updated as indicated. Interim medical history since our last visit reviewed. Allergies and medications reviewed and updated. Outpatient Medications Prior to Visit  Medication Sig Dispense Refill  . lovastatin (MEVACOR) 40 MG tablet Take 1 tablet (40 mg total) by mouth at bedtime. 90 tablet 3  . MITIGARE 0.6 MG CAPS TAKE 2 CAPSULES BY MOUTH ON FIRST DAY THEN ONE DAILY AS NEEDED FOR GOUT FLARE 90 capsule 0  . sildenafil (VIAGRA) 100 MG tablet TAKE 0.5-1 TABLET BY MOUTH DAILY AS NEEDED FOR ERECTILE DYSFUNCTION (INS. COVERS 3 IN 31 DAYS) 3 tablet 11   No facility-administered medications prior to visit.     Per HPI unless specifically indicated in ROS section below Review of Systems  Constitutional: Negative for activity change, appetite change, chills, fatigue, fever and unexpected weight change.  HENT: Negative for hearing loss.   Eyes: Negative for visual disturbance.  Respiratory: Negative for cough, chest tightness, shortness of breath and wheezing.   Cardiovascular: Negative for chest pain, palpitations and leg swelling.  Gastrointestinal: Positive for abdominal pain (see HPI). Negative for abdominal distention, blood in stool, constipation, diarrhea, nausea and vomiting.  Genitourinary: Negative for difficulty urinating and hematuria.  Musculoskeletal: Negative for arthralgias, myalgias and neck pain.  Skin: Negative  for rash.  Neurological: Negative for dizziness, seizures, syncope and headaches.  Hematological: Negative for adenopathy. Does not bruise/bleed easily.  Psychiatric/Behavioral: Negative for dysphoric mood. The patient is not nervous/anxious.    Objective:  BP (!) 154/98 (BP Location: Right Arm, Patient Position: Sitting, Cuff Size: Normal)   Pulse 68    Temp 97.8 F (36.6 C) (Temporal)   Ht 5' 7.5" (1.715 m)   Wt 225 lb 8 oz (102.3 kg)   SpO2 97%   BMI 34.80 kg/m   Wt Readings from Last 3 Encounters:  10/05/19 225 lb 8 oz (102.3 kg)  09/22/17 219 lb 8 oz (99.6 kg)  08/11/16 224 lb 8 oz (101.8 kg)      Physical Exam Vitals and nursing note reviewed.  Constitutional:      General: He is not in acute distress.    Appearance: Normal appearance. He is well-developed. He is not ill-appearing.  HENT:     Head: Normocephalic and atraumatic.     Right Ear: Hearing, tympanic membrane, ear canal and external ear normal.     Left Ear: Hearing, tympanic membrane, ear canal and external ear normal.  Eyes:     General: No scleral icterus.    Extraocular Movements: Extraocular movements intact.     Conjunctiva/sclera: Conjunctivae normal.     Pupils: Pupils are equal, round, and reactive to light.  Cardiovascular:     Rate and Rhythm: Normal rate and regular rhythm.     Pulses: Normal pulses.          Radial pulses are 2+ on the right side and 2+ on the left side.     Heart sounds: Normal heart sounds. No murmur heard.   Pulmonary:     Effort: Pulmonary effort is normal. No respiratory distress.     Breath sounds: Normal breath sounds. No wheezing, rhonchi or rales.  Abdominal:     General: Abdomen is flat. Bowel sounds are normal. There is no distension.     Palpations: Abdomen is soft. There is no mass.     Tenderness: There is no abdominal tenderness. There is no guarding or rebound.     Hernia: No hernia is present.     Comments: No reproducible abd pain  Musculoskeletal:        General: Normal range of motion.     Cervical back: Normal range of motion and neck supple.     Right lower leg: No edema.     Left lower leg: No edema.  Lymphadenopathy:     Cervical: No cervical adenopathy.  Skin:    General: Skin is warm and dry.     Findings: No rash.  Neurological:     General: No focal deficit present.     Mental Status: He is  alert and oriented to person, place, and time.     Comments: CN grossly intact, station and gait intact  Psychiatric:        Mood and Affect: Mood normal.        Behavior: Behavior normal.        Thought Content: Thought content normal.        Judgment: Judgment normal.       Results for orders placed or performed in visit on 10/05/19  Lipid panel  Result Value Ref Range   Cholesterol 201 (H) 0 - 200 mg/dL   Triglycerides 79.0 0 - 149 mg/dL   HDL 44.80 >39.00 mg/dL   VLDL 15.8 0.0 - 40.0 mg/dL  LDL Cholesterol 140 (H) 0 - 99 mg/dL   Total CHOL/HDL Ratio 4    NonHDL 156.21   Comprehensive metabolic panel  Result Value Ref Range   Sodium 140 135 - 145 mEq/L   Potassium 4.4 3.5 - 5.1 mEq/L   Chloride 104 96 - 112 mEq/L   CO2 29 19 - 32 mEq/L   Glucose, Bld 85 70 - 99 mg/dL   BUN 14 6 - 23 mg/dL   Creatinine, Ser 0.88 0.40 - 1.50 mg/dL   Total Bilirubin 0.7 0.2 - 1.2 mg/dL   Alkaline Phosphatase 48 39 - 117 U/L   AST 17 0 - 37 U/L   ALT 18 0 - 53 U/L   Total Protein 6.7 6.0 - 8.3 g/dL   Albumin 4.3 3.5 - 5.2 g/dL   GFR 87.02 >60.00 mL/min   Calcium 8.7 8.4 - 10.5 mg/dL  PSA  Result Value Ref Range   PSA 0.89 0.10 - 4.00 ng/mL  TSH  Result Value Ref Range   TSH 1.15 0.35 - 4.50 uIU/mL  Uric acid  Result Value Ref Range   Uric Acid, Serum 7.7 4.0 - 7.8 mg/dL  Microalbumin / creatinine urine ratio  Result Value Ref Range   Microalb, Ur <0.7 0.0 - 1.9 mg/dL   Creatinine,U 97.5 mg/dL   Microalb Creat Ratio 0.7 0.0 - 30.0 mg/g  Lipase  Result Value Ref Range   Lipase 37.0 11 - 59 U/L   EKG - sinus bradycardia 50s, normal axis, intervals, no acute ST/T changes, good R wave progression Assessment & Plan:  This visit occurred during the SARS-CoV-2 public health emergency.  Safety protocols were in place, including screening questions prior to the visit, additional usage of staff PPE, and extensive cleaning of exam room while observing appropriate contact time as indicated  for disinfecting solutions.   Problem List Items Addressed This Visit    Obesity, Class I, BMI 30-34.9    Weight gain noted. Encouraged healthy diet and lifestyle changes to affect sustainable weight loss.       HLD (hyperlipidemia)    Chronic, stable on lovastatin - continue statin, update FLP.  The 10-year ASCVD risk score Mikey Bussing DC Brooke Bonito., et al., 2013) is: 19.9%   Values used to calculate the score:     Age: 39 years     Sex: Male     Is Non-Hispanic African American: No     Diabetic: No     Tobacco smoker: No     Systolic Blood Pressure: 756 mmHg     Is BP treated: Yes     HDL Cholesterol: 44.8 mg/dL     Total Cholesterol: 201 mg/dL       Relevant Medications   lovastatin (MEVACOR) 40 MG tablet   sildenafil (VIAGRA) 100 MG tablet   amLODipine (NORVASC) 5 MG tablet   Other Relevant Orders   Lipid panel (Completed)   Comprehensive metabolic panel (Completed)   TSH (Completed)   Gout    Chronic, urate elevated last year, with increasing flares and body aches noted. Will trial allopurinol 100mg  daily, refilled colchicine PRN gout flare. Reviewed low purine diet, and consider daily vit C or tart cherry juice.       Relevant Orders   Uric acid (Completed)   Essential hypertension    BP staying elevated. Start amlodipine 5mg  daily. Keep track of blood pressures at home.  Baseline EKG today.       Relevant Medications   lovastatin (MEVACOR) 40  MG tablet   sildenafil (VIAGRA) 100 MG tablet   amLODipine (NORVASC) 5 MG tablet   Other Relevant Orders   Microalbumin / creatinine urine ratio (Completed)   EKG 12-Lead (Completed)   Erectile dysfunction    viagra effective - will refill.       Epigastric pain    New. Of unclear cause. Doesn't sound cardiac, not consistent with GERD. Check baseline EKG. Update if recurrent.       Relevant Orders   Lipase (Completed)   Encounter for general adult medical examination with abnormal findings - Primary    Preventative protocols  reviewed and updated unless pt declined. Discussed healthy diet and lifestyle.        Other Visit Diagnoses    Special screening for malignant neoplasm of prostate       Relevant Orders   PSA (Completed)       Meds ordered this encounter  Medications  . Colchicine (MITIGARE) 0.6 MG CAPS    Sig: TAKE 2 CAPSULES BY MOUTH ON FIRST DAY THEN ONE DAILY AS NEEDED FOR GOUT FLARE    Dispense:  90 capsule    Refill:  0  . lovastatin (MEVACOR) 40 MG tablet    Sig: Take 1 tablet (40 mg total) by mouth at bedtime.    Dispense:  90 tablet    Refill:  3  . sildenafil (VIAGRA) 100 MG tablet    Sig: TAKE 0.5-1 TABLET BY MOUTH DAILY AS NEEDED FOR ERECTILE DYSFUNCTION (INS. COVERS 3 IN 31 DAYS)    Dispense:  3 tablet    Refill:  11  . amLODipine (NORVASC) 5 MG tablet    Sig: Take 1 tablet (5 mg total) by mouth daily.    Dispense:  90 tablet    Refill:  3  . allopurinol (ZYLOPRIM) 100 MG tablet    Sig: Take 1 tablet (100 mg total) by mouth daily.    Dispense:  90 tablet    Refill:  3   Orders Placed This Encounter  Procedures  . Lipid panel  . Comprehensive metabolic panel  . PSA  . TSH  . Uric acid  . Microalbumin / creatinine urine ratio  . Lipase  . EKG 12-Lead    Patient instructions: Labs today Start allopurinol 100mg  daily - daily gout lowering medicine. Continue mitigare as needed when you get a flare.  Blood pressure is staying elevated - start amlodipine 5mg  daily  Return 04/2020 for welcome to medicare visit.  Check with [facility] to ensure you got both shingles shots, and send me dates.   Follow up plan: Return in about 6 months (around 04/05/2020) for medicare wellness visit.  Ria Bush, MD

## 2019-10-05 NOTE — Assessment & Plan Note (Signed)
BP staying elevated. Start amlodipine 5mg  daily. Keep track of blood pressures at home.  Baseline EKG today.

## 2019-10-05 NOTE — Assessment & Plan Note (Signed)
Chronic, urate elevated last year, with increasing flares and body aches noted. Will trial allopurinol 100mg  daily, refilled colchicine PRN gout flare. Reviewed low purine diet, and consider daily vit C or tart cherry juice.

## 2019-10-05 NOTE — Assessment & Plan Note (Signed)
viagra effective - will refill.

## 2019-10-05 NOTE — Telephone Encounter (Signed)
Pt said he had 1st dose of [facility] at [facility] 06/[facility]/18 and the second dose 11/25/18.

## 2019-10-05 NOTE — Telephone Encounter (Signed)
Updated pt's chart.  

## 2019-10-08 ENCOUNTER — Ambulatory Visit (INDEPENDENT_AMBULATORY_CARE_PROVIDER_SITE_OTHER): Payer: BC Managed Care – PPO | Admitting: Family Medicine

## 2019-10-08 ENCOUNTER — Other Ambulatory Visit: Payer: Self-pay

## 2019-10-08 DIAGNOSIS — J029 Acute pharyngitis, unspecified: Secondary | ICD-10-CM | POA: Diagnosis not present

## 2019-10-08 MED ORDER — AMOXICILLIN 500 MG PO CAPS: 500.0000 mg | ORAL_CAPSULE | Freq: Three times a day (TID) | ORAL | 0 refills | Status: DC

## 2019-10-08 NOTE — Progress Notes (Signed)
Virtual visit completed through MyChart, a video enabled telemedicine application. Due to national recommendations of social distancing due to COVID-19, a virtual visit is felt to be most appropriate for this patient at this time. Reviewed limitations, risks, security and privacy concerns of performing a virtual visit and the availability of in person appointments. I also reviewed that there may be a patient responsible charge related to this service. The patient agreed to proceed.   Patient location: home Provider location: [place] at [facility], office Persons participating in this virtual visit: patient, provider   If any vitals were documented, they were collected by patient at home unless specified below.    Temp 99 F (37.2 C) (Oral)   T currently 100 F  CC: ST Subjective:    Patient ID: Randy Decker, male    DOB: [birth year], 64 y.o.   MRN: [id number]  HPI: Randy Decker is a 64 y.o. male presenting on 10/08/2019 for Sore Throat (x 2 days... temp 99.0---pt has tried ibuprofen and warm salt water gargles)   Seen earlier this week for CPE- had episode of epigastric pain at that time that then resolved.   2 d h/o ST associated with low grade fever to 99. This did start after mowing lawn Thursday for 8 hours. Progressively worsening symptoms. Sore glands in neck L>R.   No strep exposure.   No PNDrainage, ear or tooth pain, cough, congestion, rhinorrhea or headache.   No h/o allergic rhinitis.   Treating with ibuprofen 600mg , warm salt water gargles with benefit.  He did complete COVID vaccines.      Relevant past medical, surgical, family and social history reviewed and updated as indicated. Interim medical history since our last visit reviewed. Allergies and medications reviewed and updated. Outpatient Medications Prior to Visit  Medication Sig Dispense Refill  . allopurinol (ZYLOPRIM) 100 MG tablet Take 1 tablet (100 mg total) by mouth daily. 90 tablet 3  .  amLODipine (NORVASC) 5 MG tablet Take 1 tablet (5 mg total) by mouth daily. 90 tablet 3  . Colchicine (MITIGARE) 0.6 MG CAPS TAKE 2 CAPSULES BY MOUTH ON FIRST DAY THEN ONE DAILY AS NEEDED FOR GOUT FLARE 90 capsule 0  . lovastatin (MEVACOR) 40 MG tablet Take 1 tablet (40 mg total) by mouth at bedtime. 90 tablet 3  . sildenafil (VIAGRA) 100 MG tablet TAKE 0.5-1 TABLET BY MOUTH DAILY AS NEEDED FOR ERECTILE DYSFUNCTION (INS. COVERS 3 IN 31 DAYS) 3 tablet 11   No facility-administered medications prior to visit.     Per HPI unless specifically indicated in ROS section below Review of Systems Objective:  Temp 99 F (37.2 C) (Oral)   Wt Readings from Last 3 Encounters:  10/05/19 225 lb 8 oz (102.3 kg)  09/22/17 219 lb 8 oz (99.6 kg)  08/11/16 224 lb 8 oz (101.8 kg)       Physical exam: Gen: alert, NAD, not ill appearing Pulm: speaks in complete sentences without increased work of breathing Psych: normal mood, normal thought content      Results for orders placed or performed in visit on 10/05/19  Lipid panel  Result Value Ref Range   Cholesterol 201 (H) 0 - 200 mg/dL   Triglycerides 79.0 0 - 149 mg/dL   HDL 44.80 >39.00 mg/dL   VLDL 15.8 0.0 - 40.0 mg/dL   LDL Cholesterol 140 (H) 0 - 99 mg/dL   Total CHOL/HDL Ratio 4    NonHDL 156.21   Comprehensive metabolic panel  Result Value Ref Range   Sodium 140 135 - 145 mEq/L   Potassium 4.4 3.5 - 5.1 mEq/L   Chloride 104 96 - 112 mEq/L   CO2 29 19 - 32 mEq/L   Glucose, Bld 85 70 - 99 mg/dL   BUN 14 6 - 23 mg/dL   Creatinine, Ser 0.88 0.40 - 1.50 mg/dL   Total Bilirubin 0.7 0.2 - 1.2 mg/dL   Alkaline Phosphatase 48 39 - 117 U/L   AST 17 0 - 37 U/L   ALT 18 0 - 53 U/L   Total Protein 6.7 6.0 - 8.3 g/dL   Albumin 4.3 3.5 - 5.2 g/dL   GFR 87.02 >60.00 mL/min   Calcium 8.7 8.4 - 10.5 mg/dL  PSA  Result Value Ref Range   PSA 0.89 0.10 - 4.00 ng/mL  TSH  Result Value Ref Range   TSH 1.15 0.35 - 4.50 uIU/mL  Uric acid  Result  Value Ref Range   Uric Acid, Serum 7.7 4.0 - 7.8 mg/dL  Microalbumin / creatinine urine ratio  Result Value Ref Range   Microalb, Ur <0.7 0.0 - 1.9 mg/dL   Creatinine,U 97.5 mg/dL   Microalb Creat Ratio 0.7 0.0 - 30.0 mg/g  Lipase  Result Value Ref Range   Lipase 37.0 11 - 59 U/L   Assessment & Plan:   Problem List Items Addressed This Visit    Sore throat    2d h/o ST associated with sore glands, fever to 100, no cough. Some symptoms suggesting bacterial infection however still could be viral. Suggested give more time to see progression - if cough, congestion develop, likely viral and should improve over time. If fever >101, worsening ST with exudates and LAD, likely bacterial and sent WASP for amoxicillin with indications when to fill. Pt agrees with plan.           Meds ordered this encounter  Medications  . amoxicillin (AMOXIL) 500 MG capsule    Sig: Take 1 capsule (500 mg total) by mouth 3 (three) times daily.    Dispense:  21 capsule    Refill:  0   No orders of the defined types were placed in this encounter.   I discussed the assessment and treatment plan with the patient. The patient was provided an opportunity to ask questions and all were answered. The patient agreed with the plan and demonstrated an understanding of the instructions. The patient was advised to call back or seek an in-person evaluation if the symptoms worsen or if the condition fails to improve as anticipated.  Follow up plan: Return if symptoms worsen or fail to improve.  Ria Bush, MD

## 2019-10-08 NOTE — Assessment & Plan Note (Signed)
2d h/o ST associated with sore glands, fever to 100, no cough. Some symptoms suggesting bacterial infection however still could be viral. Suggested give more time to see progression - if cough, congestion develop, likely viral and should improve over time. If fever >101, worsening ST with exudates and LAD, likely bacterial and sent WASP for amoxicillin with indications when to fill. Pt agrees with plan.

## 2019-10-10 ENCOUNTER — Telehealth: Payer: Self-pay

## 2019-10-10 NOTE — Telephone Encounter (Signed)
Pt had virtual visit with provider on 6/19

## 2019-10-10 NOTE — Telephone Encounter (Signed)
[place] Night - Client TELEPHONE ADVICE RECORD [facility] Patient Name: Randy Decker Gender: Male DOB: 1955/04/27 Age: 64 Y 76 M 14 D Return Phone Number: [phone] (Primary) [place] [address] Client [place] Night - Client Client Site [name], with a slight temp of 99. Translation No Nurse Assessment Nurse: Tressia Danas, RN, [place] Date/Time (Eastern Time): 10/08/2019 9:40:07 AM Confirm and document reason for call. If symptomatic, describe symptoms. ---Caller states he has a sore throat. It started today, with a slight temp of 99. caller took Ibprophen last night. Has the patient had close contact with a person known or suspected to have the novel coronavirus illness OR traveled / lives in area with major community spread (including international travel) in the last 14 days from the onset of symptoms? * If Asymptomatic, screen for exposure and travel within the last 14 days. ---No Does the patient have any new or worsening symptoms? ---Yes Will a triage be completed? ---Yes Related visit to physician within the last 2 weeks? ---No Does the PT have any chronic conditions? (i.e. diabetes, asthma, this includes High risk factors for pregnancy, etc.) ---Yes List chronic conditions. ---high blood pressure. Is this a behavioral health or substance abuse call? ---No Guidelines Guideline Title Affirmed Question Affirmed Notes Nurse Date/Time Eilene Ghazi Time) Sore Throat SEVERE (e.g., excruciating) throat  pain McClarnon, RN, [facility] 10/08/2019 9:41:48 AM Disp. Time Eilene Ghazi Time) Disposition Final User 10/08/2019 8:58:12 AM Attempt made - message left Standifer, RN, Nira Conn 10/08/2019 9:08:19 AM FINAL ATTEMPT MADE - no message left Standifer, RN, Nira Conn 10/08/2019 9:08:39 AM Send to RN Final Attempt Lissa Hoard, RN, HeatherPLEASE NOTE: All timestamps contained within this report are represented as [place] Standard Time. CONFIDENTIALTY NOTICE: This fax transmission is intended only for the addressee. It contains information that is legally privileged, confidential or otherwise protected from use or disclosure. If you are not the intended recipient, you are strictly prohibited from reviewing, disclosing, copying using or disseminating any of this information or taking any action in reliance on or regarding this information. If you have received this fax in error, please notify us immediately by telephone so that we can arrange for its return to [place]. Phone: [phone], Toll-Free: [phone], Fax: [phone] Page: 2 of 2 Call Id: [id number] [place]. Time Eilene Ghazi Time) Disposition Final User 10/08/2019 9:12:54 AM Send To RN Personal Richardson Landry, RN, Samantha 10/08/2019 9:13:09 AM Send To RN Personal Richardson Landry, RN, [place] 10/08/2019 9:44:54 AM See PCP within 24 Hours Yes McClarnon, RN, [place] Disagree/Comply Comply Caller Understands Yes PreDisposition Did not know what to do Care Advice Given Per Guideline SEE PCP WITHIN 24 HOURS: SORE THROAT: * Gargle with warm salt water four times a day. To make salt water, put 1/2 teaspoon of salt in 8 oz (240 ml) of warm water. * Sip warm chicken broth or apple juice. * Suck on hard candy or an over-thecounter throat lozenge. * You become worse. CALL BACK IF: * How can you tell if you are drinking enough liquids? The goal is to keep the urine clear or light-yellow in color. If your urine is  bright yellow or dark yellow, you are probably not drinking  enough liquids. DRINK PLENTY LIQUIDS: CARE ADVICE given per Sore Throat (Adult) guideline. Comments User: Dennard Nip, RN Date/Time Eilene Ghazi Time): 10/08/2019 9:47:10 AM provided the virtual Visit number for pt. states he will make a few calls and decide where he is going he might go to the Saturday clinic on [address] UNDECIDED

## 2020-01-03 ENCOUNTER — Ambulatory Visit: Payer: BC Managed Care – PPO | Attending: Internal Medicine

## 2020-01-03 DIAGNOSIS — Z23 Encounter for immunization: Secondary | ICD-10-CM

## 2020-01-03 NOTE — Progress Notes (Signed)
   Covid-19 Vaccination Clinic  Name:  CODEY BURLING    MRN: [id number] DOB: 1956/04/01  01/03/2020  Mr. Lurz was observed post Covid-19 immunization for 15 minutes without incident. He was provided with Vaccine Information Sheet and instruction to access the V-Safe system.   Mr. Milo was instructed to call 911 with any severe reactions post vaccine: Marland Kitchen Difficulty breathing  . Swelling of face and throat  . A fast heartbeat  . A bad rash all over body  . Dizziness and weakness

## 2020-01-19 ENCOUNTER — Other Ambulatory Visit: Payer: BC Managed Care – PPO

## 2020-05-02 ENCOUNTER — Ambulatory Visit (INDEPENDENT_AMBULATORY_CARE_PROVIDER_SITE_OTHER): Payer: PPO | Admitting: Family Medicine

## 2020-05-02 ENCOUNTER — Encounter: Payer: Self-pay | Admitting: Family Medicine

## 2020-05-02 ENCOUNTER — Other Ambulatory Visit: Payer: Self-pay

## 2020-05-02 VITALS — BP 140/84 | HR 73 | Temp 96.7°F | Ht 67.0 in | Wt 227.4 lb

## 2020-05-02 DIAGNOSIS — E785 Hyperlipidemia, unspecified: Secondary | ICD-10-CM

## 2020-05-02 DIAGNOSIS — Z125 Encounter for screening for malignant neoplasm of prostate: Secondary | ICD-10-CM | POA: Diagnosis not present

## 2020-05-02 DIAGNOSIS — Z0000 Encounter for general adult medical examination without abnormal findings: Secondary | ICD-10-CM | POA: Diagnosis not present

## 2020-05-02 DIAGNOSIS — R9431 Abnormal electrocardiogram [ECG] [EKG]: Secondary | ICD-10-CM | POA: Diagnosis not present

## 2020-05-02 DIAGNOSIS — M1A00X0 Idiopathic chronic gout, unspecified site, without tophus (tophi): Secondary | ICD-10-CM

## 2020-05-02 DIAGNOSIS — N529 Male erectile dysfunction, unspecified: Secondary | ICD-10-CM

## 2020-05-02 DIAGNOSIS — Z7189 Other specified counseling: Secondary | ICD-10-CM | POA: Insufficient documentation

## 2020-05-02 DIAGNOSIS — I10 Essential (primary) hypertension: Secondary | ICD-10-CM

## 2020-05-02 LAB — COMPREHENSIVE METABOLIC PANEL
ALT: 14 U/L (ref 0–53)
AST: 14 U/L (ref 0–37)
Albumin: 4.7 g/dL (ref 3.5–5.2)
Alkaline Phosphatase: 47 U/L (ref 39–117)
BUN: 10 mg/dL (ref 6–23)
CO2: 29 mEq/L (ref 19–32)
Calcium: 9.4 mg/dL (ref 8.4–10.5)
Chloride: 102 mEq/L (ref 96–112)
Creatinine, Ser: 0.93 mg/dL (ref 0.40–1.50)
GFR: 86.36 mL/min (ref >60.00)
Glucose, Bld: 91 mg/dL (ref 70–99)
Potassium: 4.5 mEq/L (ref 3.5–5.1)
Sodium: 137 mEq/L (ref 135–145)
Total Bilirubin: 0.7 mg/dL (ref 0.2–1.2)
Total Protein: 7.3 g/dL (ref 6.0–8.3)

## 2020-05-02 LAB — LIPID PANEL
Cholesterol: 176 mg/dL (ref 0–200)
HDL: 51.1 mg/dL (ref >39.00)
LDL Cholesterol: 104 mg/dL — ABNORMAL HIGH (ref 0–99)
NonHDL: 125.21
Total CHOL/HDL Ratio: 3
Triglycerides: 106 mg/dL (ref 0.0–149.0)
VLDL: 21.2 mg/dL (ref 0.0–40.0)

## 2020-05-02 LAB — URIC ACID: Uric Acid, Serum: 5.9 mg/dL (ref 4.0–7.8)

## 2020-05-02 LAB — PSA, MEDICARE: PSA: 1.36 ng/ml (ref 0.10–4.00)

## 2020-05-02 MED ORDER — SILDENAFIL CITRATE 20 MG PO TABS: 40.0000 mg | ORAL_TABLET | Freq: Every day | ORAL | 11 refills | Status: DC | PRN

## 2020-05-02 NOTE — Assessment & Plan Note (Addendum)
Q waves septally - denies h/o chest pain, remains asymptomatic.  Will refer to cards for consideration of POET.

## 2020-05-02 NOTE — Assessment & Plan Note (Signed)
Chronic, update labs on lovastatin. The 10-year ASCVD risk score Mikey Bussing DC Brooke Bonito., et al., 2013) is: 18.2%   Values used to calculate the score:     Age: 65 years     Sex: Male     Is Non-Hispanic African American: No     Diabetic: No     Tobacco smoker: No     Systolic Blood Pressure: 622 mmHg     Is BP treated: Yes     HDL Cholesterol: 44.8 mg/dL     Total Cholesterol: 201 mg/dL

## 2020-05-02 NOTE — Assessment & Plan Note (Signed)
Significant improvement since starting allopurinol - continue.

## 2020-05-02 NOTE — Progress Notes (Signed)
Patient ID: Randy Decker, male    DOB: Jul 16, 1955, 65 y.o.   MRN: [id number]  This visit was conducted in person.  BP 140/84 (BP Location: Right Arm, Patient Position: Sitting, Cuff Size: Normal)   Pulse 73   Temp (!) 96.7 F (35.9 C) (Temporal)   Ht 5\' 7"  (1.702 m)   Wt 227 lb 6.4 oz (103.1 kg)   SpO2 99%   BMI 35.62 kg/m    CC: welcome to medicare visit  Subjective:   HPI: Randy Decker is a 65 y.o. male presenting on 05/02/2020 for Welcome to Medicare   Furloughed 07/2018, never went back to work, now fully retired.   Hearing Screening   125Hz  250Hz  500Hz  1000Hz  2000Hz  3000Hz  4000Hz  6000Hz  8000Hz   Right ear:   25 20 25   0    Left ear:   25 20 20   0      Visual Acuity Screening   Right eye Left eye Both eyes  Without correction: 20/20 20/20 20/20   With correction:       [place] Visit from 05/02/2020 in [place] [facility] [place]  PHQ-2 Total Score 0      Fall Risk  05/02/2020  Falls in the past year? 0  Number falls in past yr: 0  Injury with Fall? 0  Follow up Falls evaluation completed    Fmhx AAA (father) - pt had normal vascular screenings through [facility] 08/2016. Fmhx CAD - father MI age 13.  Gout - on allopurinol 100mg  daily with significant improvement of flares.  ED - sildenafil 100mg  dose unaffordable. Requests 20mg  dose.   Preventative: COLONOSCOPY Date: 2015 WNL Carlean Purl)  Prostate cancer screening - discussed, continue screening, no BPH sxs  Lung cancer screening - not eligible  Flu shot - yearly  [place] 06/2019 x2, 12/2019 Tdap 2012  Hep B series 2012 zostavax 2016 shingrix -06/2018, 11/2018  Advanced directives - discussed, no living will set up. Packet provided today. Oldest daughter would be HCPOA.  Seat belt use discussed.  Sunscreen use discussed.No changing moles on skin.  Ex smoker - quit remotely 1985  Alcohol - intermittent drinking Dentist - Q6 months, due Eye exam - every other year  Bowel - no  constipation Bladder - no incontinence Currently sexually active with 1 monogamous partner. 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 Interim medical history since our last visit reviewed. Allergies and medications reviewed and updated. Outpatient Medications Prior to Visit  Medication Sig Dispense Refill  . allopurinol (ZYLOPRIM) 100 MG tablet Take 1 tablet (100 mg total) by mouth daily. 90 tablet 3  . amLODipine (NORVASC) 5 MG tablet Take 1 tablet (5 mg total) by mouth daily. 90 tablet 3  . Colchicine (MITIGARE) 0.6 MG CAPS TAKE 2 CAPSULES BY MOUTH ON FIRST DAY THEN ONE DAILY AS NEEDED FOR GOUT FLARE 90 capsule 0  . lovastatin (MEVACOR) 40 MG tablet Take 1 tablet (40 mg total) by mouth at bedtime. 90 tablet 3  . sildenafil (VIAGRA) 100 MG tablet TAKE 0.5-1 TABLET BY MOUTH DAILY AS NEEDED FOR ERECTILE DYSFUNCTION (INS. COVERS 3 IN 31 DAYS) 3 tablet 11  . amoxicillin (AMOXIL) 500 MG capsule Take 1 capsule (500  mg total) by mouth 3 (three) times daily. 21 capsule 0   No facility-administered medications prior to visit.     Per HPI unless specifically indicated in ROS section below Review of Systems Objective:  BP 140/84 (BP Location: Right Arm, Patient Position: Sitting, Cuff Size: Normal)   Pulse 73   Temp (!) 96.7 F (35.9 C) (Temporal)   Ht 5\' 7"  (1.702 m)   Wt 227 lb 6.4 oz (103.1 kg)   SpO2 99%   BMI 35.62 kg/m   Wt Readings from Last 3 Encounters:  05/02/20 227 lb 6.4 oz (103.1 kg)  10/05/19 225 lb 8 oz (102.3 kg)  09/22/17 219 lb 8 oz (99.6 kg)      Physical Exam Vitals and nursing note reviewed.  Constitutional:      General: He is not in acute distress.     Appearance: Normal appearance. He is well-developed and well-nourished. He is not ill-appearing.  HENT:     Head: Normocephalic and atraumatic.     Right Ear: Hearing, tympanic membrane, ear canal and external ear normal.     Left Ear: Hearing, tympanic membrane, ear canal and external ear normal.     Mouth/Throat:     Mouth: Oropharynx is clear and moist and mucous membranes are normal.     Pharynx: No posterior oropharyngeal edema.  Eyes:     General: No scleral icterus.    Extraocular Movements: Extraocular movements intact and EOM normal.     Conjunctiva/sclera: Conjunctivae normal.     Pupils: Pupils are equal, round, and reactive to light.  Neck:     Thyroid: No thyroid mass or thyromegaly.     Vascular: No carotid bruit.  Cardiovascular:     Rate and Rhythm: Normal rate and regular rhythm.     Pulses: Normal pulses and intact distal pulses.          Radial pulses are 2+ on the right side and 2+ on the left side.     Heart sounds: Normal heart sounds. No murmur heard.   Pulmonary:     Effort: Pulmonary effort is normal. No respiratory distress.     Breath sounds: Normal breath sounds. No wheezing, rhonchi or rales.  Abdominal:     General: Abdomen is flat. Bowel sounds are normal. There is no distension.     Palpations: Abdomen is soft. There is no mass.     Tenderness: There is no abdominal tenderness. There is no guarding or rebound.     Hernia: No hernia is present.  Musculoskeletal:        General: No edema. Normal range of motion.     Cervical back: Normal range of motion and neck supple.     Right lower leg: No edema.     Left lower leg: No edema.  Lymphadenopathy:     Cervical: No cervical adenopathy.  Skin:    General: Skin is warm and dry.     Findings: No rash.  Neurological:     General: No focal deficit present.     Mental Status: He is alert and oriented to person, place, and time.     Comments:  CN grossly intact, station and gait intact Recall  3/3 Calculation 5/5 DLROW  Psychiatric:        Mood and Affect: Mood and affect and mood normal.        Behavior: Behavior normal.        Thought Content: Thought content normal.  Judgment: Judgment normal.       Results for orders placed or performed in visit on 10/05/19  Lipid panel  Result Value Ref Range   Cholesterol 201 (H) 0 - 200 mg/dL   Triglycerides 79.0 0.0 - 149.0 mg/dL   HDL 44.80 >39.00 mg/dL   VLDL 15.8 0.0 - 40.0 mg/dL   LDL Cholesterol 140 (H) 0 - 99 mg/dL   Total CHOL/HDL Ratio 4    NonHDL 156.21   Comprehensive metabolic panel  Result Value Ref Range   Sodium 140 135 - 145 mEq/L   Potassium 4.4 3.5 - 5.1 mEq/L   Chloride 104 96 - 112 mEq/L   CO2 29 19 - 32 mEq/L   Glucose, Bld 85 70 - 99 mg/dL   BUN 14 6 - 23 mg/dL   Creatinine, Ser 0.88 0.40 - 1.50 mg/dL   Total Bilirubin 0.7 0.2 - 1.2 mg/dL   Alkaline Phosphatase 48 39 - 117 U/L   AST 17 0 - 37 U/L   ALT 18 0 - 53 U/L   Total Protein 6.7 6.0 - 8.3 g/dL   Albumin 4.3 3.5 - 5.2 g/dL   GFR 87.02 >60.00 mL/min   Calcium 8.7 8.4 - 10.5 mg/dL  PSA  Result Value Ref Range   PSA 0.89 0.10 - 4.00 ng/mL  TSH  Result Value Ref Range   TSH 1.15 0.35 - 4.50 uIU/mL  Uric acid  Result Value Ref Range   Uric Acid, Serum 7.7 4.0 - 7.8 mg/dL  Microalbumin / creatinine urine ratio  Result Value Ref Range   Microalb, Ur <0.7 0.0 - 1.9 mg/dL   Creatinine,U 97.5 mg/dL   Microalb Creat Ratio 0.7 0.0 - 30.0 mg/g  Lipase  Result Value Ref Range   Lipase 37.0 11.0 - 59.0 U/L   EKG - NSR rate 60s, normal axis, intervals, q waves septally new since 2019, no hypertrophy, acute ST/T changes.  Assessment & Plan:  This visit occurred during the SARS-CoV-2 public health emergency.  Safety protocols were in place, including screening questions prior to the visit, additional usage of staff PPE, and extensive cleaning of exam room while observing appropriate contact time as indicated for disinfecting solutions.    Problem List Items Addressed This Visit    Welcome to Medicare preventive visit - Primary    I have personally reviewed the Medicare Annual Wellness questionnaire and have noted 1. The patient's medical and social history 2. Their use of alcohol, tobacco or illicit drugs 3. Their current medications and supplements 4. The patient's functional ability including ADL's, fall risks, home safety risks and hearing or visual impairment. Cognitive function has been assessed and addressed as indicated.  5. Diet and physical activity 6. Evidence for depression or mood disorders The patients weight, height, BMI have been recorded in the chart. I have made referrals, counseling and provided education to the patient based on review of the above and I have provided the pt with a written personalized care plan for preventive services. Provider list updated.. See scanned questionairre as needed for further documentation. Reviewed preventative protocols and updated unless pt declined.       Relevant Orders   EKG 12-Lead (Completed)   Severe obesity (BMI 35.0-39.9) with comorbidity ([place])    Weight gain noted. Encouraged healthy diet and lifestyle choices to affect sustainable weight loss.       HLD (hyperlipidemia)    Chronic, update labs on lovastatin. The 10-year ASCVD risk score Mikey Bussing DC Brooke Bonito., et  al., 2013) is: 18.2%   Values used to calculate the score:     Age: 77 years     Sex: Male     Is Non-Hispanic African American: No     Diabetic: No     Tobacco smoker: No     Systolic Blood Pressure: 562 mmHg     Is BP treated: Yes     HDL Cholesterol: 44.8 mg/dL     Total Cholesterol: 201 mg/dL       Relevant Medications   sildenafil (REVATIO) 20 MG tablet   Other Relevant Orders   Lipid panel   Comprehensive metabolic panel   Gout    Significant improvement since starting allopurinol - continue.       Relevant Orders   Uric acid   Essential hypertension    Chronic, stable. Continue  amlodipine 5mg  daily.       Relevant Medications   sildenafil (REVATIO) 20 MG tablet   Erectile dysfunction    viagra unaffordable. Discussed options - will send in sildenafil 20mg  to take 2-4 tabs daily. Discussed this will be out of pocket due to low dose formulation not FDA approved for ED      Advanced directives, counseling/discussion    Advanced directives - discussed, no living will set up. Packet provided today. Oldest daughter would be HCPOA.       Abnormal EKG    Q waves septally - denies h/o chest pain, remains asymptomatic.  Will refer to cards for consideration of POET.      Relevant Orders   Ambulatory referral to Cardiology    Other Visit Diagnoses    Special screening for malignant neoplasm of prostate       Relevant Orders   PSA, Medicare       Meds ordered this encounter  Medications  . sildenafil (REVATIO) 20 MG tablet    Sig: Take 2-5 tablets (40-100 mg total) by mouth daily as needed (relations).    Dispense:  30 tablet    Refill:  11   Orders Placed This Encounter  Procedures  . Lipid panel  . Comprehensive metabolic panel  . Uric acid  . PSA, Medicare  . Ambulatory referral to Cardiology    Referral Priority:   Routine    Referral Type:   Consultation    Referral Reason:   Specialty Services Required    Requested Specialty:   Cardiology    Number of Visits Requested:   1  . EKG 12-Lead    Patient instructions: Labs today EKG today  Advanced directive packet provided today.  Good to see you today Return as needed or in 1 year for next wellness visit  We will refer you to heart doctor to check EKG.   Follow up plan: Return in about 1 year (around 05/02/2021) for medicare wellness visit.  Ria Bush, MD

## 2020-05-02 NOTE — Assessment & Plan Note (Signed)

## 2020-05-02 NOTE — Assessment & Plan Note (Signed)
viagra unaffordable. Discussed options - will send in sildenafil 20mg  to take 2-4 tabs daily. Discussed this will be out of pocket due to low dose formulation not FDA approved for ED

## 2020-05-02 NOTE — Assessment & Plan Note (Signed)
Advanced directives - discussed, no living will set up. Packet provided today. Oldest daughter would be HCPOA.

## 2020-05-02 NOTE — Assessment & Plan Note (Signed)
Chronic, stable. Continue amlodipine 5mg daily. 

## 2020-05-02 NOTE — Assessment & Plan Note (Signed)
Weight gain noted. Encouraged healthy diet and lifestyle choices to affect sustainable weight loss.  

## 2020-05-02 NOTE — Patient Instructions (Addendum)
Labs today EKG today  Advanced directive packet provided today.  Good to see you today Return as needed or in 1 year for next wellness visit  We will refer you to heart doctor to check EKG.   Health Maintenance After Age 66 After age 67, you are at a higher risk for certain long-term diseases and infections as well as injuries from falls. Falls are a major cause of broken bones and head injuries in people who are older than age 3. Getting regular preventive care can help to keep you healthy and well. Preventive care includes getting regular testing and making lifestyle changes as recommended by your health care provider. Talk with your health care provider about:  Which screenings and tests you should have. A screening is a test that checks for a disease when you have no symptoms.  A diet and exercise plan that is right for you. What should I know about screenings and tests to prevent falls? Screening and testing are the best ways to find a health problem early. Early diagnosis and treatment give you the best chance of managing medical conditions that are common after age 57. Certain conditions and lifestyle choices may make you more likely to have a fall. Your health care provider may recommend:  Regular vision checks. Poor vision and conditions such as cataracts can make you more likely to have a fall. If you wear glasses, make sure to get your prescription updated if your vision changes.  Medicine review. Work with your health care provider to regularly review all of the medicines you are taking, including over-the-counter medicines. Ask your health care provider about any side effects that may make you more likely to have a fall. Tell your health care provider if any medicines that you take make you feel dizzy or sleepy.  Osteoporosis screening. Osteoporosis is a condition that causes the bones to get weaker. This can make the bones weak and cause them to break more easily.  Blood pressure  screening. Blood pressure changes and medicines to control blood pressure can make you feel dizzy.  Strength and balance checks. Your health care provider may recommend certain tests to check your strength and balance while standing, walking, or changing positions.  Foot health exam. Foot pain and numbness, as well as not wearing proper footwear, can make you more likely to have a fall.  Depression screening. You may be more likely to have a fall if you have a fear of falling, feel emotionally low, or feel unable to do activities that you used to do.  Alcohol use screening. Using too much alcohol can affect your balance and may make you more likely to have a fall. What actions can I take to lower my risk of falls? General instructions  Talk with your health care provider about your risks for falling. Tell your health care provider if: ? You fall. Be sure to tell your health care provider about all falls, even ones that seem minor. ? You feel dizzy, sleepy, or off-balance.  Take over-the-counter and prescription medicines only as told by your health care provider. These include any supplements.  Eat a healthy diet and maintain a healthy weight. A healthy diet includes low-fat dairy products, low-fat (lean) meats, and fiber from whole grains, beans, and lots of fruits and vegetables. Home safety  Remove any tripping hazards, such as rugs, cords, and clutter.  Install safety equipment such as grab bars in bathrooms and safety rails on stairs.  Keep rooms and  walkways well-lit. Activity  Follow a regular exercise program to stay fit. This will help you maintain your balance. Ask your health care provider what types of exercise are appropriate for you.  If you need a cane or walker, use it as recommended by your health care provider.  Wear supportive shoes that have nonskid soles.   Lifestyle  Do not drink alcohol if your health care provider tells you not to drink.  If you drink  alcohol, limit how much you have: ? 0-1 drink a day for women. ? 0-2 drinks a day for men.  Be aware of how much alcohol is in your drink. In the [place] one drink equals one typical bottle of beer (12 oz), one-half glass of wine (5 oz), or one shot of hard liquor (1 oz).  Do not use any products that contain nicotine or tobacco, such as cigarettes and e-cigarettes. If you need help quitting, ask your health care provider. Summary  Having a healthy lifestyle and getting preventive care can help to protect your health and wellness after age 33.  Screening and testing are the best way to find a health problem early and help you avoid having a fall. Early diagnosis and treatment give you the best chance for managing medical conditions that are more common for people who are older than age 45.  Falls are a major cause of broken bones and head injuries in people who are older than age 51. Take precautions to prevent a fall at home.  Work with your health care provider to learn what changes you can make to improve your health and wellness and to prevent falls. This information is not intended to replace advice given to you by your health care provider. Make sure you discuss any questions you have with your health care provider. Document Revised: 07/29/2018 Document Reviewed: 02/18/2017 Elsevier Patient Education  2021 [employer].

## 2020-05-15 ENCOUNTER — Other Ambulatory Visit: Payer: Self-pay

## 2020-05-15 ENCOUNTER — Encounter: Payer: Self-pay | Admitting: Internal Medicine

## 2020-05-15 ENCOUNTER — Ambulatory Visit: Payer: PPO | Admitting: Internal Medicine

## 2020-05-15 VITALS — BP 140/90 | HR 64 | Ht 67.0 in | Wt 228.4 lb

## 2020-05-15 DIAGNOSIS — E785 Hyperlipidemia, unspecified: Secondary | ICD-10-CM | POA: Diagnosis not present

## 2020-05-15 DIAGNOSIS — R9431 Abnormal electrocardiogram [ECG] [EKG]: Secondary | ICD-10-CM | POA: Diagnosis not present

## 2020-05-15 DIAGNOSIS — Z8249 Family history of ischemic heart disease and other diseases of the circulatory system: Secondary | ICD-10-CM | POA: Diagnosis not present

## 2020-05-15 DIAGNOSIS — Z9189 Other specified personal risk factors, not elsewhere classified: Secondary | ICD-10-CM

## 2020-05-15 DIAGNOSIS — I10 Essential (primary) hypertension: Secondary | ICD-10-CM | POA: Diagnosis not present

## 2020-05-15 NOTE — Patient Instructions (Signed)
Medication Instructions:  No Changes In Medications at this time.  *If you need a refill on your cardiac medications before your next appointment, please call your pharmacy*  Testing/Procedures: Your physician has requested that you have an echocardiogram. Echocardiography is a painless test that uses sound waves to create images of your heart. It provides your doctor with information about the size and shape of your heart and how well your heart's chambers and valves are working. You may receive an ultrasound enhancing agent through an IV if needed to better visualize your heart during the echo.This procedure takes approximately one hour. There are no restrictions for this procedure. This will take place at the [address], [address].   Dr. Margaretann Loveless  has ordered a CT coronary calcium score. This test is done at [address]. This is $99 out of pocket.  Coronary CalciumScan A coronary calcium scan is an imaging test used to look for deposits of calcium and other fatty materials (plaques) in the inner lining of the blood vessels of the heart (coronary arteries). These deposits of calcium and plaques can partly clog and narrow the coronary arteries without producing any symptoms or warning signs. This puts a person at risk for a heart attack. This test can detect these deposits before symptoms develop. Tell a health care provider about:  Any allergies you have.  All medicines you are taking, including vitamins, herbs, eye drops, creams, and over-the-counter medicines.  Any problems you or family members have had with anesthetic medicines.  Any blood disorders you have.  Any surgeries you have had.  Any medical conditions you have.  Whether you are pregnant or may be pregnant. What are the risks? Generally, this is a safe procedure. However, problems may occur, including:  Harm to a pregnant woman and her unborn baby. This test involves the use of radiation.  Radiation exposure can be dangerous to a pregnant woman and her unborn baby. If you are pregnant, you generally should not have this procedure done.  Slight increase in the risk of cancer. This is because of the radiation involved in the test. What happens before the procedure? No preparation is needed for this procedure. What happens during the procedure?  You will undress and remove any jewelry around your neck or chest.  You will put on a hospital gown.  Sticky electrodes will be placed on your chest. The electrodes will be connected to an electrocardiogram (ECG) machine to record a tracing of the electrical activity of your heart.  A CT scanner will take pictures of your heart. During this time, you will be asked to lie still and hold your breath for 2-3 seconds while a picture of your heart is being taken. The procedure may vary among health care providers and hospitals. What happens after the procedure?  You can get dressed.  You can return to your normal activities.  It is up to you to get the results of your test. Ask your health care provider, or the department that is doing the test, when your results will be ready. Summary  A coronary calcium scan is an imaging test used to look for deposits of calcium and other fatty materials (plaques) in the inner lining of the blood vessels of the heart (coronary arteries).  Generally, this is a safe procedure. Tell your health care provider if you are pregnant or may be pregnant.  No preparation is needed for this procedure.  A CT scanner will  take pictures of your heart.  You can return to your normal activities after the scan is done. This information is not intended to replace advice given to you by your health care provider. Make sure you discuss any questions you have with your health care provider. Document Released: 10/04/2007 Document Revised: 02/25/2016 Document Reviewed: 02/25/2016 Elsevier Interactive Patient Education   2017 [place]: At [facility], you and your health needs are our priority.  As part of our continuing mission to provide you with exceptional heart care, we have created designated Provider Care Teams.  These Care Teams include your primary Cardiologist (physician) and Advanced Practice Providers (APPs -  Physician Assistants and Nurse Practitioners) who all work together to provide you with the care you need, when you need it.  Your next appointment:   AFTER TESTING   The format for your next appointment:   In Person  Provider:   Cherlynn Kaiser, MD

## 2020-05-15 NOTE — Progress Notes (Signed)
Cardiology Office Note:    Date:  05/15/2020   ID:  Randy Decker, DOB 10-20-1955, MRN [id number]  PCP:  Ria Bush, MD  Cardiologist:  No primary care provider on file.  Electrophysiologist:  None   Referring MD: Ria Bush, MD   Chief Complaint/Reason for Referral: Abnormal ECG  History of Present Illness:    Randy Decker is a 65 y.o. male with a history of HTN, HLD who presents for abnormal EKG at the request of Dr. Danise Mina in family medicine.  I reviewed his EKG from 05/02/20 showing possible septal infarct pattern. We reviewed this together and reviewed it in comparison to today's ECG which shows poor R wave progression - there is a small R wave in V2.   Smoked for a couple years quit 37 years ago. Former smoker.   Walks 5 x a week. 1 hr. No CP. No DOE. Rare palpitations. No syncope, no pnd, no orthopnea and no significant snoring per his report.   Hand numbness one time - went to pick up phone, hand was numb. August '21. Did not seek evaluation. He feels this was not typical for hand "falling asleep" as he had no pins and needles afterward. He was concerned about TIA.  FHx: Father MI mid 18s, AAA - 67 ruptured.   Past Medical History:  Diagnosis Date  . Essential hypertension 02/27/2015  . H/O measles    possible--can't remember  . History of chicken pox   . HLD (hyperlipidemia)   . Obesity, Class I, BMI 30-34.9 02/27/2015    Past Surgical History:  Procedure Laterality Date  . COLONOSCOPY  2015   WNL Carlean Purl)  . TONSILLECTOMY AND ADENOIDECTOMY  1964  . UMBILICAL HERNIA REPAIR  2013    Current Medications: Current Meds  Medication Sig  . allopurinol (ZYLOPRIM) 100 MG tablet Take 1 tablet (100 mg total) by mouth daily.  Marland Kitchen amLODipine (NORVASC) 5 MG tablet Take 1 tablet (5 mg total) by mouth daily.  . Colchicine (MITIGARE) 0.6 MG CAPS TAKE 2 CAPSULES BY MOUTH ON FIRST DAY THEN ONE DAILY AS NEEDED FOR GOUT FLARE  . lovastatin (MEVACOR) 40 MG  tablet Take 1 tablet (40 mg total) by mouth at bedtime.  . sildenafil (REVATIO) 20 MG tablet Take 2-5 tablets (40-100 mg total) by mouth daily as needed (relations).     Allergies:   Patient has no known allergies.   Social History   Tobacco Use  . Smoking status: Former Smoker    Packs/day: 1.00    Years: 5.00    Pack years: 5.00    Types: Cigarettes    Start date: 04/21/1978    Quit date: 04/22/1983    Years since quitting: 37.0  . Smokeless tobacco: Never Used  Substance Use Topics  . Alcohol use: Yes    Comment: Occasional  . Drug use: No     Family History: The patient's family history includes Aortic aneurysm (age of onset: 30) in his father; Hypertension in his father. There is no history of Diabetes, Cancer, Stroke, or Colon cancer.  ROS:   Please see the history of present illness.    All other systems reviewed and are negative.  EKGs/Labs/Other Studies Reviewed:    The following studies were reviewed today:  EKG:  NSR, poor R wave prog.     Recent Labs: 10/05/2019: TSH 1.15 05/02/2020: ALT 14; BUN 10; Creatinine, Ser 0.93; Potassium 4.5; Sodium 137  Recent Lipid Panel    Component Value Date/Time  CHOL 176 05/02/2020 1025   TRIG 106.0 05/02/2020 1025   HDL 51.10 05/02/2020 1025   CHOLHDL 3 05/02/2020 1025   VLDL 21.2 05/02/2020 1025   LDLCALC 104 (H) 05/02/2020 1025   LDLDIRECT 150.6 03/31/2013 1122    Physical Exam:    VS:  BP 140/90 (BP Location: Left Arm, Patient Position: Sitting)   Pulse 64   Ht 5\' 7"  (1.702 m)   Wt 228 lb 6.4 oz (103.6 kg)   SpO2 98%   BMI 35.77 kg/m     Wt Readings from Last 5 Encounters:  05/15/20 228 lb 6.4 oz (103.6 kg)  05/02/20 227 lb 6.4 oz (103.1 kg)  10/05/19 225 lb 8 oz (102.3 kg)  09/22/17 219 lb 8 oz (99.6 kg)  08/11/16 224 lb 8 oz (101.8 kg)    Constitutional: No acute distress Eyes: sclera non-icteric, normal conjunctiva and lids ENMT: normal dentition, moist mucous membranes Cardiovascular: regular  rhythm, normal rate, no murmurs. S1 and S2 normal. Radial pulses normal bilaterally. No jugular venous distention.  Respiratory: clear to auscultation bilaterally GI : normal bowel sounds, soft and nontender. No distention.   MSK: extremities warm, well perfused. No edema.  NEURO: grossly nonfocal exam, moves all extremities. PSYCH: alert and oriented x 3, normal mood and affect.   ASSESSMENT:    1. Abnormal EKG   2. Intermediate risk for coronary artery disease   3. Essential hypertension   4. Hyperlipidemia, unspecified hyperlipidemia type   5. Family history of abdominal aortic aneurysm (AAA)    PLAN:    Abnormal EKG - Plan: EKG 12-Lead, ECHOCARDIOGRAM COMPLETE - appears to have a small R wave on today's ECG less likely to represent septal infarct, but in light of possible poor R wave progression, will obtain echocardiogram.He also has had an episode of TIA per his report, and echo will help in this evaluation as well.   Hyperlipidemia, unspecified hyperlipidemia type Intermediate risk for coronary artery disease - Plan: EKG 12-Lead, CT CARDIAC SCORING (SELF PAY ONLY) - patient inquires about CV risk assessment. We discussed self pay CT cor cal screening test. He agrees and would like to proceed.  ASCVD risk score is 15.5%, intermediate risk. Will use CT cor cal to further risk stratify, and consider intensifying statin therapy for LDL goal <70.   Essential hypertension - BP mildly elevated, and same at visit with PCP. Would recommend uptitration of amlodipine or addition of HCTZ or lisinopril, he has normal renal function. Will await results of testing and adjustments can be made at that visit, or sooner with Dr. Danise Mina.   Family history of abdominal aortic aneurysm (AAA) - he has been screened in 2018 and no AAA found. He is a former, very remote smoker. Age appropriate screening complete.   Total time of encounter: 60 minutes total time of encounter, including 40 minutes  spent in face-to-face patient care on the date of this encounter. This time includes coordination of care and counseling regarding above mentioned problem list. Remainder of non-face-to-face time involved reviewing chart documents/testing relevant to the patient encounter and documentation in the medical record. I have independently reviewed documentation from referring provider.   Cherlynn Kaiser, MD [place]  [facility]     Medication Adjustments/Labs and Tests Ordered: Current medicines are reviewed at length with the patient today.  Concerns regarding medicines are outlined above.   Orders Placed This Encounter  Procedures  . CT CARDIAC SCORING (SELF PAY ONLY)  . EKG 12-Lead  .  ECHOCARDIOGRAM COMPLETE    Shared Decision Making/Informed Consent:       No orders of the defined types were placed in this encounter.   Patient Instructions  Medication Instructions:  No Changes In Medications at this time.  *If you need a refill on your cardiac medications before your next appointment, please call your pharmacy*  Testing/Procedures: Your physician has requested that you have an echocardiogram. Echocardiography is a painless test that uses sound waves to create images of your heart. It provides your doctor with information about the size and shape of your heart and how well your heart's chambers and valves are working. You may receive an ultrasound enhancing agent through an IV if needed to better visualize your heart during the echo.This procedure takes approximately one hour. There are no restrictions for this procedure. This will take place at the [address], [address].   Dr. Margaretann Loveless  has ordered a CT coronary calcium score. This test is done at [address]. This is $99 out of pocket.  Coronary CalciumScan A coronary calcium scan is an imaging test used to look for deposits of calcium and other fatty materials (plaques) in the inner lining of the  blood vessels of the heart (coronary arteries). These deposits of calcium and plaques can partly clog and narrow the coronary arteries without producing any symptoms or warning signs. This puts a person at risk for a heart attack. This test can detect these deposits before symptoms develop. Tell a health care provider about:  Any allergies you have.  All medicines you are taking, including vitamins, herbs, eye drops, creams, and over-the-counter medicines.  Any problems you or family members have had with anesthetic medicines.  Any blood disorders you have.  Any surgeries you have had.  Any medical conditions you have.  Whether you are pregnant or may be pregnant. What are the risks? Generally, this is a safe procedure. However, problems may occur, including:  Harm to a pregnant woman and her unborn baby. This test involves the use of radiation. Radiation exposure can be dangerous to a pregnant woman and her unborn baby. If you are pregnant, you generally should not have this procedure done.  Slight increase in the risk of cancer. This is because of the radiation involved in the test. What happens before the procedure? No preparation is needed for this procedure. What happens during the procedure?  You will undress and remove any jewelry around your neck or chest.  You will put on a hospital gown.  Sticky electrodes will be placed on your chest. The electrodes will be connected to an electrocardiogram (ECG) machine to record a tracing of the electrical activity of your heart.  A CT scanner will take pictures of your heart. During this time, you will be asked to lie still and hold your breath for 2-3 seconds while a picture of your heart is being taken. The procedure may vary among health care providers and hospitals. What happens after the procedure?  You can get dressed.  You can return to your normal activities.  It is up to you to get the results of your test. Ask your  health care provider, or the department that is doing the test, when your results will be ready. Summary  A coronary calcium scan is an imaging test used to look for deposits of calcium and other fatty materials (plaques) in the inner lining of the blood vessels of the heart (coronary arteries).  Generally, this is a safe procedure. Tell your health care provider if you are pregnant or may be pregnant.  No preparation is needed for this procedure.  A CT scanner will take pictures of your heart.  You can return to your normal activities after the scan is done. This information is not intended to replace advice given to you by your health care provider. Make sure you discuss any questions you have with your health care provider. Document Released: 10/04/2007 Document Revised: 02/25/2016 Document Reviewed: 02/25/2016 Elsevier Interactive Patient Education  2017 [place]: At [facility], you and your health needs are our priority.  As part of our continuing mission to provide you with exceptional heart care, we have created designated Provider Care Teams.  These Care Teams include your primary Cardiologist (physician) and Advanced Practice Providers (APPs -  Physician Assistants and Nurse Practitioners) who all work together to provide you with the care you need, when you need it.  Your next appointment:   AFTER TESTING   The format for your next appointment:   In Person  Provider:   Cherlynn Kaiser, MD

## 2020-06-04 ENCOUNTER — Other Ambulatory Visit: Payer: Self-pay

## 2020-06-04 ENCOUNTER — Ambulatory Visit
Admission: RE | Admit: 2020-06-04 | Discharge: 2020-06-04 | Disposition: A | Discharge status: Home / Self Care | Payer: Self-pay | Source: Ambulatory Visit | Attending: Internal Medicine | Admitting: Internal Medicine

## 2020-06-04 ENCOUNTER — Ambulatory Visit (HOSPITAL_COMMUNITY): Payer: PPO | Attending: Cardiology

## 2020-06-04 DIAGNOSIS — Z9189 Other specified personal risk factors, not elsewhere classified: Secondary | ICD-10-CM | POA: Insufficient documentation

## 2020-06-04 DIAGNOSIS — R9431 Abnormal electrocardiogram [ECG] [EKG]: Secondary | ICD-10-CM

## 2020-06-04 LAB — ECHOCARDIOGRAM COMPLETE
Area-P 1/2: 3.37 cm2
S' Lateral: 2.8 cm

## 2020-06-26 ENCOUNTER — Other Ambulatory Visit: Payer: Self-pay

## 2020-06-26 ENCOUNTER — Encounter: Payer: Self-pay | Admitting: Internal Medicine

## 2020-06-26 ENCOUNTER — Ambulatory Visit: Payer: PPO | Admitting: Internal Medicine

## 2020-06-26 VITALS — BP 138/90 | HR 74 | Ht 67.0 in | Wt 228.0 lb

## 2020-06-26 DIAGNOSIS — R9431 Abnormal electrocardiogram [ECG] [EKG]: Secondary | ICD-10-CM | POA: Diagnosis not present

## 2020-06-26 DIAGNOSIS — I10 Essential (primary) hypertension: Secondary | ICD-10-CM

## 2020-06-26 DIAGNOSIS — E785 Hyperlipidemia, unspecified: Secondary | ICD-10-CM

## 2020-06-26 DIAGNOSIS — Z79899 Other long term (current) drug therapy: Secondary | ICD-10-CM | POA: Diagnosis not present

## 2020-06-26 MED ORDER — AMLODIPINE BESYLATE 5 MG PO TABS: 10.0000 mg | ORAL_TABLET | Freq: Every day | ORAL | 3 refills | Status: DC

## 2020-06-26 NOTE — Patient Instructions (Signed)
Medication Instructions:  INCREASE AMLODIPINE TO 10mg  DAILY (2 TABLETS DAILY)- PLEASE CALL us FOR REFILL  *If you need a refill on your cardiac medications before your next appointment, please call your pharmacy*  Follow-Up: At [facility], you and your health needs are our priority.  As part of our continuing mission to provide you with exceptional heart care, we have created designated Provider Care Teams.  These Care Teams include your primary Cardiologist (physician) and Advanced Practice Providers (APPs -  Physician Assistants and Nurse Practitioners) who all work together to provide you with the care you need, when you need it.  Your next appointment:   2-3 Months   The format for your next appointment:   In Person  Provider:   Cherlynn Kaiser, MD

## 2020-06-26 NOTE — Progress Notes (Signed)
Cardiology Office Note:    Date:  06/26/2020   ID:  Randy Decker, DOB March 09, 1956, MRN [id number]  PCP:  Ria Bush, MD  Cardiologist:  No primary care provider on file.  Electrophysiologist:  None   Referring MD: Ria Bush, MD   Chief Complaint/Reason for Referral: Follow up abnormal ECG  History of Present Illness:    Randy Decker is a 65 y.o. male with a history of HTN, HLD who presents for abnormal EKG at the request of Dr. Danise Mina in family medicine.   06/26/2020 We reviewed results of testing including echo and coronary calcium CT. No wall motion abnormalities on echo. Ct showed moderate degree of calcifications.  The patient denies chest pain, chest pressure, dyspnea at rest or with exertion, palpitations, PND, orthopnea, or leg swelling. Denies cough, fever, chills. Denies nausea, vomiting. Denies syncope or presyncope. Denies dizziness or lightheadedness.     Past Medical History:  Diagnosis Date   Essential hypertension 02/27/2015   H/O measles    possible--can't remember   History of chicken pox    HLD (hyperlipidemia)    Obesity, Class I, BMI 30-34.9 02/27/2015    Past Surgical History:  Procedure Laterality Date   COLONOSCOPY  2015   WNL Carlean Purl)   TONSILLECTOMY AND ADENOIDECTOMY  7353   UMBILICAL HERNIA REPAIR  2013    Current Medications: Current Meds  Medication Sig   allopurinol (ZYLOPRIM) 100 MG tablet Take 1 tablet (100 mg total) by mouth daily.   amLODipine (NORVASC) 5 MG tablet Take 1 tablet (5 mg total) by mouth daily.   Colchicine (MITIGARE) 0.6 MG CAPS TAKE 2 CAPSULES BY MOUTH ON FIRST DAY THEN ONE DAILY AS NEEDED FOR GOUT FLARE   lovastatin (MEVACOR) 40 MG tablet Take 1 tablet (40 mg total) by mouth at bedtime.   sildenafil (REVATIO) 20 MG tablet Take 2-5 tablets (40-100 mg total) by mouth daily as needed (relations).     Allergies:   Patient has no known allergies.   Social History   Tobacco Use   Smoking status:  Former Smoker    Packs/day: 1.00    Years: 5.00    Pack years: 5.00    Types: Cigarettes    Start date: 04/21/1978    Quit date: 04/22/1983    Years since quitting: 37.2   Smokeless tobacco: Never Used  Substance Use Topics   Alcohol use: Yes    Comment: Occasional   Drug use: No     Family History: The patient's family history includes Aortic aneurysm (age of onset: 36) in his father; Hypertension in his father. There is no history of Diabetes, Cancer, Stroke, or Colon cancer.  ROS:   Please see the history of present illness.    All other systems reviewed and are negative.  EKGs/Labs/Other Studies Reviewed:    The following studies were reviewed today:  EKG:  n/a  I have independently reviewed the images from CT cor cal study 06/04/20.  Recent Labs: 10/05/2019: TSH 1.15 05/02/2020: ALT 14; BUN 10; Creatinine, Ser 0.93; Potassium 4.5; Sodium 137  Recent Lipid Panel    Component Value Date/Time   CHOL 176 05/02/2020 1025   TRIG 106.0 05/02/2020 1025   HDL 51.10 05/02/2020 1025   CHOLHDL 3 05/02/2020 1025   VLDL 21.2 05/02/2020 1025   LDLCALC 104 (H) 05/02/2020 1025   LDLDIRECT 150.6 03/31/2013 1122    Physical Exam:    VS:  BP 138/90 (BP Location: Left Arm, Patient Position: Sitting, Cuff  Size: Normal)    Pulse 74    Ht 5\' 7"  (1.702 m)    Wt 228 lb (103.4 kg)    BMI 35.71 kg/m     Wt Readings from Last 5 Encounters:  06/26/20 228 lb (103.4 kg)  05/15/20 228 lb 6.4 oz (103.6 kg)  05/02/20 227 lb 6.4 oz (103.1 kg)  10/05/19 225 lb 8 oz (102.3 kg)  09/22/17 219 lb 8 oz (99.6 kg)    Constitutional: No acute distress Eyes: sclera non-icteric, normal conjunctiva and lids ENMT: normal dentition, moist mucous membranes Cardiovascular: regular rhythm, normal rate, no murmurs. S1 and S2 normal. Radial pulses normal bilaterally. No jugular venous distention.  Respiratory: clear to auscultation bilaterally GI : normal bowel sounds, soft and nontender. No distention.   MSK:  extremities warm, well perfused. No edema.  NEURO: grossly nonfocal exam, moves all extremities. PSYCH: alert and oriented x 3, normal mood and affect.   ASSESSMENT:    1. Abnormal EKG   2. Hyperlipidemia, unspecified hyperlipidemia type   3. Essential hypertension   4. Medication management    PLAN:    Abnormal EKG - no RWMA on echo. No current concern for prior infarct or structural heart disease.   Hyperlipidemia, unspecified hyperlipidemia type - ASCVD risk score is 15.5%, intermediate risk. Cor cal score 80 which is 51st percentile. Would like to intensify statin therapy. Will discuss further at next follow up. Continue lovastatin currently.   Essential hypertension - BP mildly elevated, and same at visit with PCP. Would recommend uptitration of amlodipine to 10 mg daily.   - borderline aortic sinus dilation on echo, will repeat an echo in 1 year to review.    Family history of abdominal aortic aneurysm (AAA) - he has been screened in 2018 and no AAA found. He is a former, very remote smoker. Age appropriate screening complete.   Total time of encounter: 30 minutes total time of encounter, including 23 minutes spent in face-to-face patient care on the date of this encounter. This time includes coordination of care and counseling regarding above mentioned problem list. Remainder of non-face-to-face time involved reviewing chart documents/testing relevant to the patient encounter and documentation in the medical record. I have independently reviewed documentation from referring provider.   Cherlynn Kaiser, MD, [facility]    Medication Adjustments/Labs and Tests Ordered: Current medicines are reviewed at length with the patient today.  Concerns regarding medicines are outlined above.   No orders of the defined types were placed in this encounter.   Meds ordered this encounter  Medications   DISCONTD: amLODipine (NORVASC) 5 MG tablet    Sig: Take 2  tablets (10 mg total) by mouth daily.    Dispense:  90 tablet    Refill:  3    Patient Instructions  Medication Instructions:  INCREASE AMLODIPINE TO 10mg  DAILY (2 TABLETS DAILY)- PLEASE CALL us FOR REFILL  *If you need a refill on your cardiac medications before your next appointment, please call your pharmacy*  Follow-Up: At [facility], you and your health needs are our priority.  As part of our continuing mission to provide you with exceptional heart care, we have created designated Provider Care Teams.  These Care Teams include your primary Cardiologist (physician) and Advanced Practice Providers (APPs -  Physician Assistants and Nurse Practitioners) who all work together to provide you with the care you need, when you need it.  Your next appointment:   2-3 Months  The format for your next appointment:   In Person  Provider:   Cherlynn Kaiser, MD

## 2020-09-20 ENCOUNTER — Other Ambulatory Visit: Payer: Self-pay | Admitting: Family Medicine

## 2020-10-05 ENCOUNTER — Encounter: Payer: Self-pay | Admitting: Internal Medicine

## 2020-10-05 ENCOUNTER — Ambulatory Visit: Payer: PPO | Admitting: Internal Medicine

## 2020-10-05 ENCOUNTER — Other Ambulatory Visit: Payer: Self-pay

## 2020-10-05 VITALS — BP 144/80 | HR 63 | Ht 68.0 in | Wt 210.2 lb

## 2020-10-05 DIAGNOSIS — I10 Essential (primary) hypertension: Secondary | ICD-10-CM

## 2020-10-05 DIAGNOSIS — I2510 Atherosclerotic heart disease of native coronary artery without angina pectoris: Secondary | ICD-10-CM | POA: Diagnosis not present

## 2020-10-05 DIAGNOSIS — R9431 Abnormal electrocardiogram [ECG] [EKG]: Secondary | ICD-10-CM | POA: Diagnosis not present

## 2020-10-05 DIAGNOSIS — E785 Hyperlipidemia, unspecified: Secondary | ICD-10-CM | POA: Diagnosis not present

## 2020-10-05 DIAGNOSIS — Z79899 Other long term (current) drug therapy: Secondary | ICD-10-CM

## 2020-10-05 DIAGNOSIS — I2584 Coronary atherosclerosis due to calcified coronary lesion: Secondary | ICD-10-CM | POA: Diagnosis not present

## 2020-10-05 MED ORDER — AMLODIPINE BESYLATE 10 MG PO TABS: 10.0000 mg | ORAL_TABLET | Freq: Every day | ORAL | 3 refills | Status: DC

## 2020-10-05 NOTE — Patient Instructions (Signed)
Medication Instructions:   Will  send new prescription for Amlodipine 10 mg  *If you need a refill on your cardiac medications before your next appointment, please call your pharmacy*   Lab Work:  Not needed   Testing/Procedures:  Not needed  Follow-Up: At [facility], you and your health needs are our priority.  As part of our continuing mission to provide you with exceptional heart care, we have created designated Provider Care Teams.  These Care Teams include your primary Cardiologist (physician) and Advanced Practice Providers (APPs -  Physician Assistants and Nurse Practitioners) who all work together to provide you with the care you need, when you need it.     Your next appointment:   5 to 6 month(s)  The format for your next appointment:   In Person  Provider:   Cherlynn Kaiser, MD

## 2020-10-05 NOTE — Progress Notes (Signed)
Cardiology Office Note:    Date:  10/05/2020   ID:  Randy Decker, DOB 01/27/1956, MRN [id number]  PCP:  Ria Bush, MD  Cardiologist:  None  Electrophysiologist:  None   Referring MD: Ria Bush, MD   Chief Complaint/Reason for Referral: Follow up abnormal ECG  History of Present Illness:    Randy Decker is a 65 y.o. male with a history of HTN, HLD who presents for abnormal EKG at the request of Dr. Danise Mina in family medicine.  10/05/2020 Today, he is feeling okay overall. We discussed his EKG results from 04/2020 with Dr. Danise Mina and his subsequent Echo which showed no RWMA.   At home his blood pressure is typically well-controlled. 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 He believes this contributed to his high blood pressure at clinic today.  Previously when he was on atorvastatin, he believes he developed a side effect of feeling warm. This was confirmed per Dr. Bosie Clos notes from a visit 4-5 years ago. Currently he is not taking aspirin, and asks if he should start this again.  He is going to defer any new medications until he has returned from his planned vacation. Also, he is scheduled for his annual physical for Medicare early next year.  He denies any chest pain, shortness of breath, palpitations, or exertional symptoms. No headaches, lightheadedness, or syncope to report. Also has no lower extremity edema, orthopnea or PND.     Past Medical History:  Diagnosis Date   Essential hypertension 02/27/2015   H/O measles    possible--can't remember   History of chicken pox    HLD (hyperlipidemia)    Obesity, Class I, BMI 30-34.9 02/27/2015    Past Surgical History:  Procedure Laterality Date   COLONOSCOPY  2015   WNL Carlean Purl)   TONSILLECTOMY AND ADENOIDECTOMY  8315   UMBILICAL HERNIA REPAIR  2013    Current Medications: Current Meds  Medication Sig   allopurinol (ZYLOPRIM) 100 MG tablet TAKE 1 TABLET BY MOUTH EVERY DAY    amLODipine (NORVASC) 5 MG tablet TAKE 1 TABLET BY MOUTH EVERY DAY   Colchicine (MITIGARE) 0.6 MG CAPS TAKE 2 CAPSULES BY MOUTH ON FIRST DAY THEN ONE DAILY AS NEEDED FOR GOUT FLARE   lovastatin (MEVACOR) 40 MG tablet TAKE 1 TABLET BY MOUTH EVERYDAY AT BEDTIME   sildenafil (REVATIO) 20 MG tablet Take 2-5 tablets (40-100 mg total) by mouth daily as needed (relations).     Allergies:   Patient has no known allergies.   Social History   Tobacco Use   Smoking status: Former    Packs/day: 1.00    Years: 5.00    Pack years: 5.00    Types: Cigarettes    Start date: 04/21/1978    Quit date: 04/22/1983    Years since quitting: 37.4   Smokeless tobacco: Never  Substance Use Topics   Alcohol use: Yes    Comment: Occasional   Drug use: No     Family History: The patient's family history includes Aortic aneurysm (age of onset: 39) in his father; Hypertension in his father. There is no history of Diabetes, Cancer, Stroke, or Colon cancer.  ROS:   Please see the history of present illness.    All other systems reviewed and are negative.  EKGs/Labs/Other Studies Reviewed:    The following studies were reviewed today:  Echo 06/04/2020:  1. Left ventricular ejection fraction, by estimation, is 60 to 65%. The  left ventricle has normal  function. The left ventricle has no regional  wall motion abnormalities. Left ventricular diastolic parameters were  normal.   2. Right ventricular systolic function is normal. The right ventricular  size is normal.   3. The mitral valve is normal in structure. Trivial mitral valve  regurgitation. No evidence of mitral stenosis.   4. The aortic valve is tricuspid. Aortic valve regurgitation is not  visualized. No aortic stenosis is present.   5. Aortic dilatation noted. There is mild dilatation of the aortic root,  measuring 40 mm.   6. The inferior vena cava is normal in size with greater than 50%  respiratory variability, suggesting right atrial pressure of  3 mmHg.  CT Cardiac Scoring 06/04/2020: IMPRESSION: Coronary calcium score of 80. This was 51st percentile for age and sex matched control. Coronary calcifications are seen in the left main coronary artery and proximal RCA.  EKG:  10/05/2020: NSR, rate 63 bpm  06/26/2020: EKG was not ordered.  I have independently reviewed the images from CT cor cal study 06/04/20.  Recent Labs: 05/02/2020: ALT 14; BUN 10; Creatinine, Ser 0.93; Potassium 4.5; Sodium 137  Recent Lipid Panel    Component Value Date/Time   CHOL 176 05/02/2020 1025   TRIG 106.0 05/02/2020 1025   HDL 51.10 05/02/2020 1025   CHOLHDL 3 05/02/2020 1025   VLDL 21.2 05/02/2020 1025   LDLCALC 104 (H) 05/02/2020 1025   LDLDIRECT 150.6 03/31/2013 1122    Physical Exam:    VS:  BP (!) 144/80   Pulse 63   Ht 5\' 8"  (1.727 m)   Wt 210 lb 3.2 oz (95.3 kg)   SpO2 98%   BMI 31.96 kg/m     Wt Readings from Last 5 Encounters:  10/05/20 210 lb 3.2 oz (95.3 kg)  06/26/20 228 lb (103.4 kg)  05/15/20 228 lb 6.4 oz (103.6 kg)  05/02/20 227 lb 6.4 oz (103.1 kg)  10/05/19 225 lb 8 oz (102.3 kg)    Constitutional: No acute distress Eyes: sclera non-icteric, normal conjunctiva and lids ENMT: normal dentition, moist mucous membranes Cardiovascular: regular rhythm, normal rate, no murmurs. S1 and S2 normal. Radial pulses normal bilaterally. No jugular venous distention.  Respiratory: clear to auscultation bilaterally GI : normal bowel sounds, soft and nontender. No distention.   MSK: extremities warm, well perfused. No edema.  NEURO: grossly nonfocal exam, moves all extremities. PSYCH: alert and oriented x 3, normal mood and affect.   ASSESSMENT:    1. Essential hypertension   2. Hyperlipidemia, unspecified hyperlipidemia type   3. Medication management   4. Abnormal EKG   5. Coronary artery calcification    PLAN:    Coronary artery calcifications - CAC score of 80 which is 51st percentile. Would resume aspirin 81 mg daily  for secondary prevention of CAD.   Abnormal EKG - EKG normal today. Abnl ECG may be related to lead placement.  - no RWMA on echo. No current concern for prior infarct or structural heart disease.   Hyperlipidemia, unspecified hyperlipidemia type - ASCVD risk score is 15.5%, intermediate risk. Cor cal score 80 which is 51st percentile. Would like to intensify statin therapy. He would like to wait until he returns from vacation so he does not have side effects. Continue lovastatin currently. He had previously tried atorvastatin, would recommend trying crestor and can start with low dose.    Essential hypertension - BP mildly elevated, and same at visit with PCP. Continue amlodipine to 10 mg daily. May need  to add additional therapy. - borderline aortic sinus dilation on echo, will repeat an echo in 1 year to review.    Family history of abdominal aortic aneurysm (AAA) - he has been screened in 2018 and no AAA found. He is a former, very remote smoker. Age appropriate screening complete.   Total time of encounter: 30 minutes total time of encounter, including 25 minutes spent in face-to-face patient care on the date of this encounter. This time includes coordination of care and counseling regarding above mentioned problem list. Remainder of non-face-to-face time involved reviewing chart documents/testing relevant to the patient encounter and documentation in the medical record. I have independently reviewed documentation from referring provider.   Cherlynn Kaiser, MD, [facility]    Medication Adjustments/Labs and Tests Ordered: Current medicines are reviewed at length with the patient today.  Concerns regarding medicines are outlined above.   Orders Placed This Encounter  Procedures   EKG 12-Lead     Meds ordered this encounter  Medications   amLODipine (NORVASC) 10 MG tablet    Sig: Take 1 tablet (10 mg total) by mouth daily.    Dispense:  90 tablet     Refill:  3    Patient will call when  ready to be filled     Patient Instructions  Medication Instructions:   Will  send new prescription for Amlodipine 10 mg  *If you need a refill on your cardiac medications before your next appointment, please call your pharmacy*   Lab Work:  Not needed   Testing/Procedures:  Not needed  Follow-Up: At [facility], you and your health needs are our priority.  As part of our continuing mission to provide you with exceptional heart care, we have created designated Provider Care Teams.  These Care Teams include your primary Cardiologist (physician) and Advanced Practice Providers (APPs -  Physician Assistants and Nurse Practitioners) who all work together to provide you with the care you need, when you need it.     Your next appointment:   5 to 6 month(s)  The format for your next appointment:   In Person  Provider:   Cherlynn Kaiser, MD     [facility] Stumpf,acting as a scribe for Elouise Munroe, MD.,have documented all relevant documentation on the behalf of Elouise Munroe, MD,as directed by  Elouise Munroe, MD while in the presence of Elouise Munroe, MD.  I, Elouise Munroe, MD, have reviewed all documentation for this visit. The documentation on 10/05/20 for the exam, diagnosis, procedures, and orders are all accurate and complete.

## 2020-10-15 ENCOUNTER — Other Ambulatory Visit: Payer: Self-pay

## 2020-10-15 ENCOUNTER — Ambulatory Visit (INDEPENDENT_AMBULATORY_CARE_PROVIDER_SITE_OTHER): Payer: PPO | Admitting: Family Medicine

## 2020-10-15 ENCOUNTER — Encounter: Payer: Self-pay | Admitting: Family Medicine

## 2020-10-15 VITALS — BP 120/74 | HR 69 | Temp 97.7°F | Ht 68.0 in | Wt 210.1 lb

## 2020-10-15 DIAGNOSIS — R1013 Epigastric pain: Secondary | ICD-10-CM

## 2020-10-15 MED ORDER — SILDENAFIL CITRATE 100 MG PO TABS: 50.0000 mg | ORAL_TABLET | Freq: Every day | ORAL | 6 refills | Status: DC | PRN

## 2020-10-15 NOTE — Assessment & Plan Note (Signed)
Symptoms suspicious for biliary colic.  Check labs and abd [place].  Reviewed pathophysiology of gallstone disease as well as preventative measures at home including avoiding greasy fried foods.  Further plan based on results. Reviewed red flags to seek urgent care.  ddx includes GERD, gastritis, pancreatitis - check labs today.

## 2020-10-15 NOTE — Progress Notes (Signed)
Patient ID: DEVEION DENZ, male    DOB: 03-29-1956, 65 y.o.   MRN: [id number]  This visit was conducted in person.  BP 120/74   Pulse 69   Temp 97.7 F (36.5 C) (Temporal)   Ht 5\' 8"  (1.727 m)   Wt 210 lb 2 oz (95.3 kg)   SpO2 96%   BMI 31.95 kg/m    CC: abd pain Subjective:   HPI: JUVENTINO PAVONE is a 65 y.o. male presenting on 10/15/2020 for Abdominal Pain (C/o RUQ abd pain.  Started about 3 wks ago.  Thinks it may be gallstones.  Denies any N/V/D.  )   Symptoms started 09/25/2020 after long day mowing. Symptoms started after supper - severe epigastric abdominal pain (crescendo-decrescendo pattern) with radiation to R shoulder blade that lasted 6-8 hours associated with a few days of constipation. No associated nausea/vomiting, jaundice/icterus, fevers/chills.   Second episode 10/06/2020 of epigastric pain lasted 3-4 hours, less severe than first episode.   Recently started on amlodipine 10mg  by cardiology (Dr Margaretann Loveless).   Mild soreness/swelling to R clavicular region.  14 lb weight loss over the past 3 months.  Lives alone.      Relevant past medical, surgical, family and social history reviewed and updated as indicated. Interim medical history since our last visit reviewed. Allergies and medications reviewed and updated. Outpatient Medications Prior to Visit  Medication Sig Dispense Refill   allopurinol (ZYLOPRIM) 100 MG tablet TAKE 1 TABLET BY MOUTH EVERY DAY 90 tablet 2   amLODipine (NORVASC) 10 MG tablet Take 1 tablet (10 mg total) by mouth daily. 90 tablet 3   Colchicine (MITIGARE) 0.6 MG CAPS TAKE 2 CAPSULES BY MOUTH ON FIRST DAY THEN ONE DAILY AS NEEDED FOR GOUT FLARE 90 capsule 0   lovastatin (MEVACOR) 40 MG tablet TAKE 1 TABLET BY MOUTH EVERYDAY AT BEDTIME 90 tablet 2   sildenafil (VIAGRA) 100 MG tablet Take 50-100 mg by mouth daily as needed.     sildenafil (REVATIO) 20 MG tablet Take 2-5 tablets (40-100 mg total) by mouth daily as needed (relations). 30 tablet  11   No facility-administered medications prior to visit.     Per HPI unless specifically indicated in ROS section below Review of Systems  Objective:  BP 120/74   Pulse 69   Temp 97.7 F (36.5 C) (Temporal)   Ht 5\' 8"  (1.727 m)   Wt 210 lb 2 oz (95.3 kg)   SpO2 96%   BMI 31.95 kg/m   Wt Readings from Last 3 Encounters:  10/15/20 210 lb 2 oz (95.3 kg)  10/05/20 210 lb 3.2 oz (95.3 kg)  06/26/20 228 lb (103.4 kg)      Physical Exam Vitals and nursing note reviewed.  Constitutional:      Appearance: Normal appearance. He is not ill-appearing.  Eyes:     Extraocular Movements: Extraocular movements intact.     Pupils: Pupils are equal, round, and reactive to light.  Neck:     Thyroid: No thyroid mass or thyromegaly.  Cardiovascular:     Rate and Rhythm: Normal rate and regular rhythm.     Pulses: Normal pulses.     Heart sounds: Normal heart sounds. No murmur heard. Pulmonary:     Effort: Pulmonary effort is normal. No respiratory distress.     Breath sounds: Normal breath sounds. No wheezing, rhonchi or rales.  Abdominal:     General: Bowel sounds are normal. There is no distension.  Palpations: Abdomen is soft. There is no mass.     Tenderness: There is no abdominal tenderness. There is no right CVA tenderness, left CVA tenderness, guarding or rebound.     Hernia: No hernia is present.  Musculoskeletal:     Cervical back: Normal range of motion and neck supple. No rigidity.     Right lower leg: No edema.     Left lower leg: No edema.  Lymphadenopathy:     Cervical: No cervical adenopathy.  Skin:    General: Skin is warm and dry.     Findings: No rash.  Neurological:     Mental Status: He is alert.  Psychiatric:        Mood and Affect: Mood normal.        Behavior: Behavior normal.      Results for orders placed or performed in visit on 06/04/20  ECHOCARDIOGRAM COMPLETE  Result Value Ref Range   Area-P 1/2 3.37 cm2   S' Lateral 2.80 cm     Assessment & Plan:  This visit occurred during the SARS-CoV-2 public health emergency.  Safety protocols were in place, including screening questions prior to the visit, additional usage of staff PPE, and extensive cleaning of exam room while observing appropriate contact time as indicated for disinfecting solutions.   Problem List Items Addressed This Visit     Epigastric abdominal pain - Primary    Symptoms suspicious for biliary colic.  Check labs and abd [place].  Reviewed pathophysiology of gallstone disease as well as preventative measures at home including avoiding greasy fried foods.  Further plan based on results. Reviewed red flags to seek urgent care.  ddx includes GERD, gastritis, pancreatitis - check labs today.        Relevant Orders   Comprehensive metabolic panel   CBC with Differential/Platelet   Lipase   US Abdomen Complete     Meds ordered this encounter  Medications   sildenafil (VIAGRA) 100 MG tablet    Sig: Take 0.5-1 tablets (50-100 mg total) by mouth daily as needed.    Dispense:  10 tablet    Refill:  6    Orders Placed This Encounter  Procedures   US Abdomen Complete    Standing Status:   Future    Standing Expiration Date:   10/15/2021    Order Specific Question:   Reason for Exam (SYMPTOM  OR DIAGNOSIS REQUIRED)    Answer:   epigastric pain    Order Specific Question:   Preferred imaging location?    Answer:   [facility]   Comprehensive metabolic panel   CBC with Differential/Platelet   Lipase     Patient instructions: Possible gallstone causing symptoms. Check labs today, we will also get you set up for right sided abdominal ultrasound.  Pending results we will discuss next steps.  Watch for fever, nausea/vomiting, or skin turning yellow - all of which can be signs of infection.   Follow up plan: Return if symptoms worsen or fail to improve.  Ria Bush, MD

## 2020-10-15 NOTE — Patient Instructions (Addendum)
Possible gallstone causing symptoms. Check labs today, we will also get you set up for abdominal ultrasound.  Pending results we will discuss next steps.  Watch for fever, nausea/vomiting, or skin turning yellow - all of which can be signs of infection.   Biliary Colic, Adult  Biliary colic is severe pain caused by a problem with the gallbladder. The gallbladder is a small organ in the upper right part of the abdomen. The gallbladder stores a digestive fluid produced in the liver (bile) that helps the body break down fat. Bile and other digestive enzymes are carried from the liver to the small intestine through tube-like structurescalled bile ducts. The gallbladder and the bile ducts form the biliary tract. Sometimes, hard deposits of digestive fluids (gallstones) form in the gallbladder and block the flow of bile from the gallbladder, causing biliary colic. This condition is also called a gallbladder attack. Gallstones can be as small as a grain of sand or as big as a golf ball. Therecould be just one gallstone in the gallbladder, or there could be many. What are the causes? This condition is usually caused by gallstones. Less often, a tumor could blockthe flow of bile from the gallbladder and trigger biliary colic. What increases the risk? The following factors may make you more likely to develop this condition: Being male. Having a family history of gallstones. Being obese. Losing weight suddenly or quickly. Eating a diet that is high in calories, low in fiber, and rich in refined carbohydrates, such as white bread and white rice. Having certain health conditions, such as: An intestinal disease that affects nutrient absorption, such as Crohn's disease. A metabolic condition, such as diabetes or metabolic syndrome. Metabolic syndrome occurs when someone has high blood pressure, high cholesterol, and diabetes. A blood condition, such as hemolytic anemia or sickle cell disease. What are the  signs or symptoms? The main symptom of this condition is severe pain in the upper right side of the abdomen. You may feel this pain below the chest but above the hip. This pain often occurs at night or after eating a meal that is high in fat. This pain may get worse for up to an hour and last as long as 12 hours. In most cases, the pain fades (subsides) within 2 hours. Other symptoms of this condition include: Nausea and vomiting. Pain under the right shoulder. How is this diagnosed? This condition is diagnosed based on your medical history, your symptoms, and aphysical exam. You may also have tests, including: Blood tests to rule out infection or inflammation of the bile ducts, gallbladder, pancreas, or liver. Imaging studies, such as: An ultrasound. A CT scan. An MRI. In some cases, you may need to have an imaging study done using a small amount of radioactive material (nuclear medicine) to confirm the diagnosis. How is this treated? This condition may be treated with medicines to: Relieve your pain or nausea. Dissolve the gallstones. It may take months or years before the gallstones are completely gone. If you have gallstones, or if you have a tumor in the gallbladder that is causing biliary colic, you may need surgery to remove the gallbladder (cholecystectomy). Follow these instructions at home: Eating and drinking Drink enough fluid to keep your urine pale yellow. Follow instructions from your health care provider about eating or drinking restrictions. These may include avoiding: Fatty, greasy, and fried foods. Any foods that make the pain worse. Overeating. Having a large meal after not eating for a while. General instructions  Take over-the-counter and prescription medicines only as told by your health care provider. Keep all follow-up visits as told by your health care provider. This is important. How is this prevented? Steps to prevent this condition include: Maintaining a  healthy body weight. Getting regular exercise. Eating a healthy diet that is high in fiber and low in fat. Limiting how much sugar and refined carbohydrates you eat. Contact a health care provider if: Your pain lasts more than 5 hours. You vomit. You have a fever and chills. Your pain gets worse. Get help right away if: Your skin or the whites of your eyes look yellow (jaundice). Your have tea-colored urine and light-colored stools (feces). You are dizzy or you faint. Summary Biliary colic is severe pain caused by a problem with the gallbladder. The gallbladder is a small organ in the upper right part of your abdomen. Treatment for this condition may include medicine to relieve your pain or nausea, or medicine to slowly dissolve the gallstones. If you have gallstones, or if you have a tumor in the gallbladder that is causing biliary colic, you may need surgery to remove the gallbladder (cholecystectomy). This information is not intended to replace advice given to you by your health care provider. Make sure you discuss any questions you have with your healthcare provider. Document Revised: 04/19/2019 Document Reviewed: 02/08/2019 Elsevier Patient Education  [place].

## 2020-10-16 LAB — COMPREHENSIVE METABOLIC PANEL
ALT: 12 U/L (ref 0–53)
AST: 12 U/L (ref 0–37)
Albumin: 4.1 g/dL (ref 3.5–5.2)
Alkaline Phosphatase: 52 U/L (ref 39–117)
BUN: 18 mg/dL (ref 6–23)
CO2: 25 mEq/L (ref 19–32)
Calcium: 8.7 mg/dL (ref 8.4–10.5)
Chloride: 104 mEq/L (ref 96–112)
Creatinine, Ser: 0.98 mg/dL (ref 0.40–1.50)
GFR: 80.84 mL/min (ref >60.00)
Glucose, Bld: 92 mg/dL (ref 70–99)
Potassium: 4.1 mEq/L (ref 3.5–5.1)
Sodium: 137 mEq/L (ref 135–145)
Total Bilirubin: 0.4 mg/dL (ref 0.2–1.2)
Total Protein: 6.5 g/dL (ref 6.0–8.3)

## 2020-10-16 LAB — CBC WITH DIFFERENTIAL/PLATELET
Basophils Absolute: 0 10*3/uL (ref 0.0–0.1)
Basophils Relative: 0.4 % (ref 0.0–3.0)
Eosinophils Absolute: 0.1 10*3/uL (ref 0.0–0.7)
Eosinophils Relative: 1.6 % (ref 0.0–5.0)
HCT: 39.7 % (ref 39.0–52.0)
Hemoglobin: 13.7 g/dL (ref 13.0–17.0)
Lymphocytes Relative: 26.6 % (ref 12.0–46.0)
Lymphs Abs: 2 10*3/uL (ref 0.7–4.0)
MCHC: 34.6 g/dL (ref 30.0–36.0)
MCV: 89.9 fl (ref 78.0–100.0)
Monocytes Absolute: 0.6 10*3/uL (ref 0.1–1.0)
Monocytes Relative: 8.5 % (ref 3.0–12.0)
Neutro Abs: 4.7 10*3/uL (ref 1.4–7.7)
Neutrophils Relative %: 62.9 % (ref 43.0–77.0)
Platelets: 246 10*3/uL (ref 150.0–400.0)
RBC: 4.42 Mil/uL (ref 4.22–5.81)
RDW: 12.6 % (ref 11.5–15.5)
WBC: 7.4 10*3/uL (ref 4.0–10.5)

## 2020-10-16 LAB — LIPASE: Lipase: 38 U/L (ref 11.0–59.0)

## 2020-11-06 ENCOUNTER — Ambulatory Visit
Admission: RE | Admit: 2020-11-06 | Discharge: 2020-11-06 | Disposition: A | Discharge status: Home / Self Care | Payer: PPO | Source: Ambulatory Visit | Attending: Family Medicine | Admitting: Family Medicine

## 2020-11-06 ENCOUNTER — Other Ambulatory Visit: Payer: Self-pay

## 2020-11-06 DIAGNOSIS — R1013 Epigastric pain: Secondary | ICD-10-CM

## 2020-11-06 DIAGNOSIS — K8020 Calculus of gallbladder without cholecystitis without obstruction: Secondary | ICD-10-CM | POA: Diagnosis not present

## 2020-11-06 DIAGNOSIS — N281 Cyst of kidney, acquired: Secondary | ICD-10-CM | POA: Diagnosis not present

## 2020-11-12 ENCOUNTER — Other Ambulatory Visit: Payer: Self-pay | Admitting: Family Medicine

## 2020-11-12 DIAGNOSIS — K8020 Calculus of gallbladder without cholecystitis without obstruction: Secondary | ICD-10-CM

## 2020-11-12 DIAGNOSIS — R1013 Epigastric pain: Secondary | ICD-10-CM

## 2020-12-17 ENCOUNTER — Encounter: Payer: Self-pay | Admitting: Family Medicine

## 2020-12-17 ENCOUNTER — Other Ambulatory Visit: Payer: Self-pay | Admitting: Family Medicine

## 2021-01-31 DIAGNOSIS — K8010 Calculus of gallbladder with chronic cholecystitis without obstruction: Secondary | ICD-10-CM | POA: Diagnosis not present

## 2021-02-09 DIAGNOSIS — H16143 Punctate keratitis, bilateral: Secondary | ICD-10-CM | POA: Diagnosis not present

## 2021-03-06 ENCOUNTER — Other Ambulatory Visit: Payer: Self-pay

## 2021-03-06 ENCOUNTER — Encounter: Payer: Self-pay | Admitting: Family Medicine

## 2021-03-06 ENCOUNTER — Ambulatory Visit (INDEPENDENT_AMBULATORY_CARE_PROVIDER_SITE_OTHER): Payer: PPO | Admitting: Family Medicine

## 2021-03-06 VITALS — BP 140/82 | HR 70 | Temp 97.9°F | Ht 68.0 in | Wt 206.0 lb

## 2021-03-06 DIAGNOSIS — R21 Rash and other nonspecific skin eruption: Secondary | ICD-10-CM | POA: Diagnosis not present

## 2021-03-06 LAB — CBC WITH DIFFERENTIAL/PLATELET
Basophils Absolute: 0.1 10*3/uL (ref 0.0–0.1)
Basophils Relative: 1.3 % (ref 0.0–3.0)
Eosinophils Absolute: 0.1 10*3/uL (ref 0.0–0.7)
Eosinophils Relative: 2.2 % (ref 0.0–5.0)
HCT: 42.4 % (ref 39.0–52.0)
Hemoglobin: 14.2 g/dL (ref 13.0–17.0)
Lymphocytes Relative: 25.6 % (ref 12.0–46.0)
Lymphs Abs: 1.5 10*3/uL (ref 0.7–4.0)
MCHC: 33.4 g/dL (ref 30.0–36.0)
MCV: 90.6 fl (ref 78.0–100.0)
Monocytes Absolute: 0.4 10*3/uL (ref 0.1–1.0)
Monocytes Relative: 7.3 % (ref 3.0–12.0)
Neutro Abs: 3.6 10*3/uL (ref 1.4–7.7)
Neutrophils Relative %: 63.6 % (ref 43.0–77.0)
Platelets: 270 10*3/uL (ref 150.0–400.0)
RBC: 4.68 Mil/uL (ref 4.22–5.81)
RDW: 12.8 % (ref 11.5–15.5)
WBC: 5.7 10*3/uL (ref 4.0–10.5)

## 2021-03-06 LAB — COMPREHENSIVE METABOLIC PANEL
ALT: 19 U/L (ref 0–53)
AST: 19 U/L (ref 0–37)
Albumin: 4.5 g/dL (ref 3.5–5.2)
Alkaline Phosphatase: 51 U/L (ref 39–117)
BUN: 9 mg/dL (ref 6–23)
CO2: 27 mEq/L (ref 19–32)
Calcium: 9 mg/dL (ref 8.4–10.5)
Chloride: 103 mEq/L (ref 96–112)
Creatinine, Ser: 0.85 mg/dL (ref 0.40–1.50)
GFR: 90.85 mL/min (ref >60.00)
Glucose, Bld: 85 mg/dL (ref 70–99)
Potassium: 4.1 mEq/L (ref 3.5–5.1)
Sodium: 139 mEq/L (ref 135–145)
Total Bilirubin: 0.6 mg/dL (ref 0.2–1.2)
Total Protein: 6.9 g/dL (ref 6.0–8.3)

## 2021-03-06 LAB — TSH: TSH: 1.88 u[IU]/mL (ref 0.35–5.50)

## 2021-03-06 LAB — CK: Total CK: 66 U/L (ref 7–232)

## 2021-03-06 LAB — SEDIMENTATION RATE: Sed Rate: 14 mm/hr (ref 0–20)

## 2021-03-06 NOTE — Assessment & Plan Note (Addendum)
Irritant contact dermatitis vs other ?autoimmune, however not consistent with malar rash, heliotrope eruption or rosacea. Doubt med related.  No other significant systemic symptoms however will start evaluation with labwork.  rec in interim cortizone-10 OTC, regular vaseline and/or moisturizing cream (aveeno, eucerin, etc).  Update if not improving, discussed next step is dermatology referral.

## 2021-03-06 NOTE — Patient Instructions (Addendum)
For facial rash, use cortizone-10 topically 1-2 times daily for no more than 10 days. May also use moisturizing cream like cerave or aveeno or eucerin (non fragranced)  Labs today.  If no improvement, let me know for dermatology referral. If you see dermatologist, stop steroid cream 2 weeks prior to seeing them.

## 2021-03-06 NOTE — Progress Notes (Addendum)
Patient ID: Randy Decker, male    DOB: [birth year], 65 y.o.   MRN: [id number]  This visit was conducted in person.  BP 140/82   Pulse 70   Temp 97.9 F (36.6 C) (Temporal)   Ht 5\' 8"  (1.727 m)   Wt 206 lb (93.4 kg)   SpO2 100%   BMI 31.32 kg/m    CC: "my face is starting to breakout" Subjective:   HPI: Randy Decker is a 65 y.o. male presenting on 03/06/2021 for Rash (C/o rash on face, off and on.  Started about 2 wks ago, 4th time in 6 mos.  Area is itchy then peels when going away.  Also, c/o itchy scalp.  Sometimes feels itching in other areas but no rash. )   2 wk h/o new itchy facial rash associated with red skin, dry chapped lips, itching to external ears. Skin feels coarse. Has also had rash to left hand between digits. Rest of skin feels itchy but he hasn't noted associated rash. No gottron papules. Eyes look more red today than normal.   He tried cortizone-10 with intermittent benefit. Also tried OTC oatmeal lotion - this burned.   Similar episode back in June after visiting friend in the hospital. Episode happened again a month later.   No new lotions, detergents, soaps or shampoos. No new medicines, foods, supplements.  No fevers/chills, abd pain, nausea, diarrhea or other bowel changes, new joint pains, oral lesions, fatigue.   Denies fmhx autoimmune disease   Upcoming gallbladder surgery 04/2021.   Working on weight loss - walking regularly.      Relevant past medical, surgical, family and social history reviewed and updated as indicated. Interim medical history since our last visit reviewed. Allergies and medications reviewed and updated. Outpatient Medications Prior to Visit  Medication Sig Dispense Refill   allopurinol (ZYLOPRIM) 100 MG tablet TAKE 1 TABLET BY MOUTH EVERY DAY 90 tablet 2   amLODipine (NORVASC) 10 MG tablet Take 1 tablet (10 mg total) by mouth daily. 90 tablet 3   Colchicine (MITIGARE) 0.6 MG CAPS TAKE 2 CAPSULES BY MOUTH ON FIRST  DAY THEN ONE DAILY AS NEEDED FOR GOUT FLARE 90 capsule 0   lovastatin (MEVACOR) 40 MG tablet TAKE 1 TABLET BY MOUTH EVERYDAY AT BEDTIME 90 tablet 2   sildenafil (VIAGRA) 100 MG tablet TAKE 0.5 TO 1 TABLET BY MOUTH DAILY AS NEEDED FOR ERECTILE DYSFUNCTION (INS. COVERS 3 IN 31 DAYS). 5 tablet 11   No facility-administered medications prior to visit.     Per HPI unless specifically indicated in ROS section below Review of Systems  Objective:  BP 140/82   Pulse 70   Temp 97.9 F (36.6 C) (Temporal)   Ht 5\' 8"  (1.727 m)   Wt 206 lb (93.4 kg)   SpO2 100%   BMI 31.32 kg/m   Wt Readings from Last 3 Encounters:  03/06/21 206 lb (93.4 kg)  10/15/20 210 lb 2 oz (95.3 kg)  10/05/20 210 lb 3.2 oz (95.3 kg)      Physical Exam Vitals and nursing note reviewed.  Constitutional:      Appearance: Normal appearance. He is not ill-appearing.  HENT:     Head: Normocephalic and atraumatic.     Right Ear: Tympanic membrane normal.     Left Ear: Tympanic membrane normal.  Eyes:     Extraocular Movements: Extraocular movements intact.     Pupils: Pupils are equal, round, and reactive to light.  Comments: Mild injection of conunctiva  Musculoskeletal:        General: Normal range of motion.     Right lower leg: No edema.     Left lower leg: No edema.  Skin:    General: Skin is warm and dry.     Findings: Erythema and rash present.     Comments:  Erythema to face on forehead, cheeks and surrounding eyes with evidence of thickened skin, however nose spared. Lips dry and chapped.  Dry skin dermatitis changes to left hand interdigital spaces between 2nd/3rd and 3rd/4th digits  Neurological:     Mental Status: He is alert.  Psychiatric:        Mood and Affect: Mood normal.        Behavior: Behavior normal.          Results for orders placed or performed in visit on 10/15/20  Comprehensive metabolic panel  Result Value Ref Range   Sodium 137 135 - 145 mEq/L   Potassium 4.1 3.5 - 5.1  mEq/L   Chloride 104 96 - 112 mEq/L   CO2 25 19 - 32 mEq/L   Glucose, Bld 92 70 - 99 mg/dL   BUN 18 6 - 23 mg/dL   Creatinine, Ser 0.98 0.40 - 1.50 mg/dL   Total Bilirubin 0.4 0.2 - 1.2 mg/dL   Alkaline Phosphatase 52 39 - 117 U/L   AST 12 0 - 37 U/L   ALT 12 0 - 53 U/L   Total Protein 6.5 6.0 - 8.3 g/dL   Albumin 4.1 3.5 - 5.2 g/dL   GFR 80.84 >60.00 mL/min   Calcium 8.7 8.4 - 10.5 mg/dL  CBC with Differential/Platelet  Result Value Ref Range   WBC 7.4 4.0 - 10.5 K/uL   RBC 4.42 4.22 - 5.81 Mil/uL   Hemoglobin 13.7 13.0 - 17.0 g/dL   HCT 39.7 39.0 - 52.0 %   MCV 89.9 78.0 - 100.0 fl   MCHC 34.6 30.0 - 36.0 g/dL   RDW 12.6 11.5 - 15.5 %   Platelets 246.0 150.0 - 400.0 K/uL   Neutrophils Relative % 62.9 43.0 - 77.0 %   Lymphocytes Relative 26.6 12.0 - 46.0 %   Monocytes Relative 8.5 3.0 - 12.0 %   Eosinophils Relative 1.6 0.0 - 5.0 %   Basophils Relative 0.4 0.0 - 3.0 %   Neutro Abs 4.7 1.4 - 7.7 K/uL   Lymphs Abs 2.0 0.7 - 4.0 K/uL   Monocytes Absolute 0.6 0.1 - 1.0 K/uL   Eosinophils Absolute 0.1 0.0 - 0.7 K/uL   Basophils Absolute 0.0 0.0 - 0.1 K/uL  Lipase  Result Value Ref Range   Lipase 38.0 11.0 - 59.0 U/L    Assessment & Plan:  This visit occurred during the SARS-CoV-2 public health emergency.  Safety protocols were in place, including screening questions prior to the visit, additional usage of staff PPE, and extensive cleaning of exam room while observing appropriate contact time as indicated for disinfecting solutions.   Problem List Items Addressed This Visit     Facial rash - Primary    Irritant contact dermatitis vs other ?autoimmune, however not consistent with malar rash, heliotrope eruption or rosacea. Doubt med related.  No other significant systemic symptoms however will start evaluation with labwork.  rec in interim cortizone-10 OTC, regular vaseline and/or moisturizing cream (aveeno, eucerin, etc).  Update if not improving, discussed next step is  dermatology referral.       Relevant Orders  Sedimentation rate   ANA   Comprehensive metabolic panel   TSH   CBC with Differential/Platelet   CK   Anti-scleroderma antibody     No orders of the defined types were placed in this encounter.  Orders Placed This Encounter  Procedures   Sedimentation rate   ANA   Comprehensive metabolic panel   TSH   CBC with Differential/Platelet   CK   Anti-scleroderma antibody     Patient Instructions  For facial rash, use cortizone-10 topically 1-2 times daily for no more than 10 days. May also use moisturizing cream like cerave or aveeno or eucerin (non fragranced)  Labs today.  If no improvement, let me know for dermatology referral. If you see dermatologist, stop steroid cream 2 weeks prior to seeing them.   Follow up plan: Return if symptoms worsen or fail to improve.  Ria Bush, MD

## 2021-03-06 NOTE — Addendum Note (Signed)
Addended by: Ria Bush on: 03/06/2021 08:40 AM   Modules accepted: Orders

## 2021-03-07 LAB — ANA: Anti Nuclear Antibody (ANA): NEGATIVE

## 2021-03-07 LAB — ANTI-SCLERODERMA ANTIBODY: Scleroderma (Scl-70) (ENA) Antibody, IgG: <1 AI

## 2021-03-08 ENCOUNTER — Other Ambulatory Visit: Payer: Self-pay | Admitting: Family Medicine

## 2021-03-08 DIAGNOSIS — R21 Rash and other nonspecific skin eruption: Secondary | ICD-10-CM

## 2021-03-26 DIAGNOSIS — L239 Allergic contact dermatitis, unspecified cause: Secondary | ICD-10-CM | POA: Diagnosis not present

## 2021-03-30 NOTE — Progress Notes (Signed)
Cardiology Office Note:    Date:  04/04/2021   ID:  Randy Decker, DOB [birth year], MRN [id number]  PCP:  Ria Bush, MD  Cardiologist:  Elouise Munroe, MD  Electrophysiologist:  None   Referring MD: Ria Bush, MD   Chief Complaint: follow-up of coronary calcifications, hypertension, and hyperlipidemia  History of Present Illness:    Randy Decker is a 65 y.o. male with a history of coronary artery calcification with coronary calcium score of 80 in 05/2020, mildly dilated aortic root, hypertension, hyperlipidemia, and obesity who is followed by Dr. Margaretann Loveless and presents today for routine follow-up of coronary calcifications, hypertension, and hyperlipidemia.  Patient was referred to Dr. Margaretann Loveless in 04/2020 for further evaluation of abnormal EKG showing possible septal infarct pattern.  Dr. Margaretann Loveless reviewed this EKG as well as the EKG at visit with her which showed poor R wave progression.  However, patient denied any cardiac symptoms.  He was walking 5 times a week for an hour at a time and denied any chest pain or shortness of breath with this.  He did report one episode of hand numbness back in 11/2019 which she did not seek evaluation for but was concerned may be a TIA.  Echo and coronary calcium score were ordered for further evaluation.  Echo showed LVEF of 60 to 65% with normal wall motion, normal diastolic parameters, and mild dilatation of the aortic root measuring 40 mm.  No significant valvular disease was noted.  Calcium score was 80 placing MI at the 51st percentile for age and sex.  Coronary calcifications were seen in the left main coronary artery and proximal RCA. Ascending aorta was measured at 37 mm.   She was last seen by Dr. Margaretann Loveless in 09/2020 at which time he was doing well from a cardiac standpoint.  He was advised to restart Aspirin 81 mg daily given elevated coronary calcium score.  It was also recommended that his statin therapy be intensified; however, he  requested to wait until he returns from vacation so he does not have side effects.  He was continued on lovastatin at that time with plans to try Crestor at a low dose after he returns from vacation.  Patient presents today for follow-up.  Here alone.  Patient has done very well from a cardiac standpoint since his last visit.  He states he has lost about 10-15 lbs by watching his meal portions and increasing his activity.  He denies any cardiac symptoms.  No chest pain, shortness of breath, orthopnea, PND, lower extremity edema, palpitations, lightheadedness, dizziness, syncope.  He was walking 1.5 miles every day (unless the weather is bad) and denies any symptoms with this.  He has been having gallbladder issues and is scheduled to have a cholecystectomy next month.  Past Medical History:  Diagnosis Date   Essential hypertension 02/27/2015   H/O measles    possible--can't remember   History of chicken pox    HLD (hyperlipidemia)    Obesity, Class I, BMI 30-34.9 02/27/2015    Past Surgical History:  Procedure Laterality Date   COLONOSCOPY  2015   WNL Carlean Purl)   TONSILLECTOMY AND ADENOIDECTOMY  0626   UMBILICAL HERNIA REPAIR  2013    Current Medications: Current Meds  Medication Sig   allopurinol (ZYLOPRIM) 100 MG tablet TAKE 1 TABLET BY MOUTH EVERY DAY   Colchicine (MITIGARE) 0.6 MG CAPS TAKE 2 CAPSULES BY MOUTH ON FIRST DAY THEN ONE DAILY AS NEEDED FOR GOUT FLARE  lovastatin (MEVACOR) 40 MG tablet TAKE 1 TABLET BY MOUTH EVERYDAY AT BEDTIME   sildenafil (VIAGRA) 100 MG tablet TAKE 0.5 TO 1 TABLET BY MOUTH DAILY AS NEEDED FOR ERECTILE DYSFUNCTION (INS. COVERS 3 IN 31 DAYS).     Allergies:   Patient has no known allergies.   Social History   Socioeconomic History   Marital status: Single    Spouse name: Not on file   Number of children: Not on file   Years of education: Not on file   Highest education level: Not on file  Occupational History   Not on file  Tobacco Use    Smoking status: Former    Packs/day: 1.00    Years: 5.00    Pack years: 5.00    Types: Cigarettes    Start date: 04/21/1978    Quit date: 04/22/1983    Years since quitting: 37.9   Smokeless tobacco: Never  Substance and Sexual Activity   Alcohol use: Yes    Comment: Occasional   Drug use: No   Sexual activity: Not on file  Other Topics Concern   Not on file  Social History Narrative   Divorced.    Lives alone, 2 cats and 1 dog   Occupation: maintenance Training and development officer)   Edu: Secretary/administrator   Activity: active at work   Diet: some water, fruits/vegetables daily, rare meat   Social Determinants of Radio broadcast assistant Strain: Not on file  Food Insecurity: Not on file  Transportation Needs: Not on file  Physical Activity: Not on file  Stress: Not on file  Social Connections: Not on file     Family History: The patient's family history includes Aortic aneurysm (age of onset: 63) in his father; Hypertension in his father. There is no history of Diabetes, Cancer, Stroke, or Colon cancer.  ROS:   Please see the history of present illness.     EKGs/Labs/Other Studies Reviewed:    The following studies were reviewed today:  Echocardiogram 06/04/2020: Impressions: 1. Left ventricular ejection fraction, by estimation, is 60 to 65%. The  left ventricle has normal function. The left ventricle has no regional  wall motion abnormalities. Left ventricular diastolic parameters were  normal.   2. Right ventricular systolic function is normal. The right ventricular  size is normal.   3. The mitral valve is normal in structure. Trivial mitral valve  regurgitation. No evidence of mitral stenosis.   4. The aortic valve is tricuspid. Aortic valve regurgitation is not  visualized. No aortic stenosis is present.   5. Aortic dilatation noted. There is mild dilatation of the aortic root,  measuring 40 mm.   6. The inferior vena cava is normal in size with greater than 50%  respiratory  variability, suggesting right atrial pressure of 3 mmHg. _______________  CT Cardiac Scoring 06/04/2020: Impression: Coronary calcium score of 80. This was 51st percentile for age and sex matched control. Coronary calcifications are seen in the left main coronary artery and proximal RCA.  EKG:  EKG not ordered today.   Recent Labs: 03/06/2021: ALT 19; BUN 9; Creatinine, Ser 0.85; Hemoglobin 14.2; Platelets 270.0; Potassium 4.1; Sodium 139; TSH 1.88  Recent Lipid Panel    Component Value Date/Time   CHOL 176 05/02/2020 1025   TRIG 106.0 05/02/2020 1025   HDL 51.10 05/02/2020 1025   CHOLHDL 3 05/02/2020 1025   VLDL 21.2 05/02/2020 1025   LDLCALC 104 (H) 05/02/2020 1025   LDLDIRECT 150.6 03/31/2013 1122  Physical Exam:    Vital Signs: BP 122/72   Pulse 67   Ht 5\' 8"  (1.727 m)   Wt 205 lb 6.4 oz (93.2 kg)   SpO2 98%   BMI 31.23 kg/m     Wt Readings from Last 3 Encounters:  04/04/21 205 lb 6.4 oz (93.2 kg)  03/06/21 206 lb (93.4 kg)  10/15/20 210 lb 2 oz (95.3 kg)     General: 65 y.o. Caucasian male in no acute distress. HEENT: Normocephalic and atraumatic. Sclera clear.  Neck: Supple. No carotid bruits. No JVD. Heart: RRR. Distinct S1 and S2. No murmurs, gallops, or rubs. Radial pulses 2+ and equal bilaterally. Lungs: No increased work of breathing. Clear to ausculation bilaterally. No wheezes, rhonchi, or rales.  Abdomen: Soft, non-distended, and non-tender to palpation.  Extremities: No lower extremity edema.    Skin: Warm and dry. Neuro: Alert and oriented x3. No focal deficits. Psych: Normal affect. Responds appropriately.  Assessment:    1. Coronary artery calcification   2. Dilated aortic root ([place])   3. Primary hypertension   4. Hyperlipidemia, unspecified hyperlipidemia type   5. Pre-op evaluation     Plan:    Coronary Artery Calcifications Coronary calcium score in 05/2020 was 80 placing MI at the 51st percentile for age and sex.  Coronary  calcifications were seen in the left main coronary artery and proximal RCA. - No chest pain. - Continue aspirin and statin.  Dilated Aortic Root Echo in 05/2020 showed mild dilatation of the aortic root measuring 40 mm. - Plan is for repeat Echo in 05/2021.  Will order this today.  Hypertension BP well controlled. - Continue amlodipine 10 mg daily.  Hyperlipidemia Lipid panel in 04/2020: Total Cholesterol 176, Triglycerides 106, HDL 51, LDL 104.  LDL goal < 70 given coronary artery calcifications. - Currently on Lovastatin 40 mg daily.  He was previously on Lipitor but noted feeling warm on this.  At last visit, Dr. Margaretann Loveless recommended stopping Lovastatin and trying Crestor 10 mg daily; however, he wanted to wait until after he got back from vacation due to to concerns for side effects. He is scheduled to have repeat lipid panel checked next month at his PCPs. Have asked that he have these results faxed to [place]. If LDL above goal, will switch to Crestor.  Pre-Op Evaluation Patient is scheduled to have a laparoscopic cholecystectomy next month. He is doing well with no cardiac standpoint. Per Revised Cardiac Risk Index, considered low risk with 0.9% chance of an adverse cardiac event. Able to complete complete >4.0 METS without any problems. Therefore, based on ACC/AHA guidelines, patient would be at acceptable risk for the planned procedure without further cardiovascular testing. OK to hold Aspirin if needed prior to procedure but please restart this as soon as safely possible postoperatively. Of note, we have not received a pre-op clearance for this yet.    Disposition: Follow up in 1 year.   Medication Adjustments/Labs and Tests Ordered: Current medicines are reviewed at length with the patient today.  Concerns regarding medicines are outlined above.  No orders of the defined types were placed in this encounter.  No orders of the defined types were placed in this encounter.   There are no  Patient Instructions on file for this visit.   Signed, Darreld Mclean, PA-C  04/04/2021 1:52 PM    [facility]

## 2021-04-04 ENCOUNTER — Ambulatory Visit: Payer: PPO | Admitting: Internal Medicine

## 2021-04-04 ENCOUNTER — Ambulatory Visit: Payer: PPO | Admitting: Student

## 2021-04-04 ENCOUNTER — Other Ambulatory Visit: Payer: Self-pay

## 2021-04-04 ENCOUNTER — Encounter: Payer: Self-pay | Admitting: Student

## 2021-04-04 VITALS — BP 122/72 | HR 67 | Ht 68.0 in | Wt 205.4 lb

## 2021-04-04 DIAGNOSIS — I77810 Thoracic aortic ectasia: Secondary | ICD-10-CM

## 2021-04-04 DIAGNOSIS — I2510 Atherosclerotic heart disease of native coronary artery without angina pectoris: Secondary | ICD-10-CM

## 2021-04-04 DIAGNOSIS — E785 Hyperlipidemia, unspecified: Secondary | ICD-10-CM

## 2021-04-04 DIAGNOSIS — Z01810 Encounter for preprocedural cardiovascular examination: Secondary | ICD-10-CM

## 2021-04-04 DIAGNOSIS — I2584 Coronary atherosclerosis due to calcified coronary lesion: Secondary | ICD-10-CM

## 2021-04-04 DIAGNOSIS — Z01818 Encounter for other preprocedural examination: Secondary | ICD-10-CM

## 2021-04-04 DIAGNOSIS — I10 Essential (primary) hypertension: Secondary | ICD-10-CM

## 2021-04-04 MED ORDER — ASPIRIN EC 81 MG PO TBEC: 81.0000 mg | DELAYED_RELEASE_TABLET | Freq: Every day | ORAL | 3 refills | Status: AC

## 2021-04-04 NOTE — Addendum Note (Signed)
Addended by: Jacqulynn Cadet on: 04/04/2021 02:36 PM   Modules accepted: Orders

## 2021-04-04 NOTE — Patient Instructions (Signed)
Medication Instructions:  RESTART Aspirin 81 mg daily  *If you need a refill on your cardiac medications before your next appointment, please call your pharmacy*  Lab Work: NONE ordered at this time of appointment   If you have labs (blood work) drawn today and your tests are completely normal, you will receive your results only by: [place] (if you have MyChart) OR A paper copy in the mail If you have any lab test that is abnormal or we need to change your treatment, we will call you to review the results.  Testing/Procedures: Your physician has requested that you have an echocardiogram. Echocardiography is a painless test that uses sound waves to create images of your heart. It provides your doctor with information about the size and shape of your heart and how well your hearts chambers and valves are working. This procedure takes approximately one hour. There are no restrictions for this procedure.  Please schedule for March 2023  Follow-Up: At [facility], you and your health needs are our priority.  As part of our continuing mission to provide you with exceptional heart care, we have created designated Provider Care Teams.  These Care Teams include your primary Cardiologist (physician) and Advanced Practice Providers (APPs -  Physician Assistants and Nurse Practitioners) who all work together to provide you with the care you need, when you need it.  Your next appointment:   1 year(s)  The format for your next appointment:   In Person  Provider:   Elouise Munroe, MD    Other Instructions

## 2021-04-04 NOTE — Addendum Note (Signed)
Addended by: Jacqulynn Cadet on: 04/04/2021 01:57 PM   Modules accepted: Orders

## 2021-04-23 ENCOUNTER — Ambulatory Visit: Payer: Self-pay | Admitting: Surgery

## 2021-05-07 ENCOUNTER — Ambulatory Visit (INDEPENDENT_AMBULATORY_CARE_PROVIDER_SITE_OTHER): Payer: PPO | Admitting: Family Medicine

## 2021-05-07 ENCOUNTER — Encounter: Payer: Self-pay | Admitting: Family Medicine

## 2021-05-07 ENCOUNTER — Encounter (HOSPITAL_BASED_OUTPATIENT_CLINIC_OR_DEPARTMENT_OTHER): Payer: Self-pay | Admitting: Surgery

## 2021-05-07 ENCOUNTER — Other Ambulatory Visit: Payer: Self-pay

## 2021-05-07 VITALS — BP 122/70 | HR 65 | Temp 97.8°F | Ht 67.5 in | Wt 202.6 lb

## 2021-05-07 DIAGNOSIS — I2584 Coronary atherosclerosis due to calcified coronary lesion: Secondary | ICD-10-CM | POA: Insufficient documentation

## 2021-05-07 DIAGNOSIS — E785 Hyperlipidemia, unspecified: Secondary | ICD-10-CM | POA: Diagnosis not present

## 2021-05-07 DIAGNOSIS — K8020 Calculus of gallbladder without cholecystitis without obstruction: Secondary | ICD-10-CM

## 2021-05-07 DIAGNOSIS — Z7189 Other specified counseling: Secondary | ICD-10-CM

## 2021-05-07 DIAGNOSIS — M1A00X0 Idiopathic chronic gout, unspecified site, without tophus (tophi): Secondary | ICD-10-CM | POA: Diagnosis not present

## 2021-05-07 DIAGNOSIS — Z125 Encounter for screening for malignant neoplasm of prostate: Secondary | ICD-10-CM | POA: Diagnosis not present

## 2021-05-07 DIAGNOSIS — Z0000 Encounter for general adult medical examination without abnormal findings: Secondary | ICD-10-CM

## 2021-05-07 DIAGNOSIS — I77810 Thoracic aortic ectasia: Secondary | ICD-10-CM

## 2021-05-07 DIAGNOSIS — Z0001 Encounter for general adult medical examination with abnormal findings: Secondary | ICD-10-CM

## 2021-05-07 DIAGNOSIS — I10 Essential (primary) hypertension: Secondary | ICD-10-CM

## 2021-05-07 DIAGNOSIS — I2510 Atherosclerotic heart disease of native coronary artery without angina pectoris: Secondary | ICD-10-CM | POA: Insufficient documentation

## 2021-05-07 DIAGNOSIS — R21 Rash and other nonspecific skin eruption: Secondary | ICD-10-CM

## 2021-05-07 LAB — URIC ACID: Uric Acid, Serum: 5.9 mg/dL (ref 4.0–7.8)

## 2021-05-07 LAB — LIPID PANEL
Cholesterol: 134 mg/dL (ref 0–200)
HDL: 47.5 mg/dL (ref >39.00)
LDL Cholesterol: 68 mg/dL (ref 0–99)
NonHDL: 86
Total CHOL/HDL Ratio: 3
Triglycerides: 90 mg/dL (ref 0.0–149.0)
VLDL: 18 mg/dL (ref 0.0–40.0)

## 2021-05-07 LAB — PSA, MEDICARE: PSA: 1.72 ng/ml (ref 0.10–4.00)

## 2021-05-07 MED ORDER — LOVASTATIN 40 MG PO TABS: ORAL_TABLET | ORAL | 3 refills | Status: DC

## 2021-05-07 MED ORDER — ALLOPURINOL 100 MG PO TABS: 100.0000 mg | ORAL_TABLET | Freq: Every day | ORAL | 3 refills | Status: DC

## 2021-05-07 MED ORDER — AMLODIPINE BESYLATE 10 MG PO TABS: 10.0000 mg | ORAL_TABLET | Freq: Every day | ORAL | 3 refills | Status: DC

## 2021-05-07 MED ORDER — COLCHICINE 0.6 MG PO TABS: 0.6000 mg | ORAL_TABLET | Freq: Every day | ORAL | 3 refills | Status: DC | PRN

## 2021-05-07 NOTE — Assessment & Plan Note (Signed)
This has resolved. Autoimmune testing returned normal. Saw derm - pending patch testing.

## 2021-05-07 NOTE — Assessment & Plan Note (Signed)
Preventative protocols reviewed and updated unless pt declined. Discussed healthy diet and lifestyle.  

## 2021-05-07 NOTE — Assessment & Plan Note (Signed)
No recent gout flares - continue low dose allopurinol.

## 2021-05-07 NOTE — Assessment & Plan Note (Addendum)

## 2021-05-07 NOTE — Assessment & Plan Note (Signed)
Congratulated on weight loss to date - encouraged ongoing healthy diet and lifestyle choices.

## 2021-05-07 NOTE — Assessment & Plan Note (Signed)
Symptomatic gallstones pending lap chole by gen surgery later this month.

## 2021-05-07 NOTE — Patient Instructions (Addendum)
Advanced directive packet provided today.  Labs today  Good to see you today Continue regular walking routine.  You are doing well today.  I hope you have a speedy recovery from upcoming surgery!  Health Maintenance After Age 67 After age 30, you are at a higher risk for certain long-term diseases and infections as well as injuries from falls. Falls are a major cause of broken bones and head injuries in people who are older than age 45. Getting regular preventive care can help to keep you healthy and well. Preventive care includes getting regular testing and making lifestyle changes as recommended by your health care provider. Talk with your health care provider about: Which screenings and tests you should have. A screening is a test that checks for a disease when you have no symptoms. A diet and exercise plan that is right for you. What should I know about screenings and tests to prevent falls? Screening and testing are the best ways to find a health problem early. Early diagnosis and treatment give you the best chance of managing medical conditions that are common after age 69. Certain conditions and lifestyle choices may make you more likely to have a fall. Your health care provider may recommend: Regular vision checks. Poor vision and conditions such as cataracts can make you more likely to have a fall. If you wear glasses, make sure to get your prescription updated if your vision changes. Medicine review. Work with your health care provider to regularly review all of the medicines you are taking, including over-the-counter medicines. Ask your health care provider about any side effects that may make you more likely to have a fall. Tell your health care provider if any medicines that you take make you feel dizzy or sleepy. Strength and balance checks. Your health care provider may recommend certain tests to check your strength and balance while standing, walking, or changing positions. Foot  health exam. Foot pain and numbness, as well as not wearing proper footwear, can make you more likely to have a fall. Screenings, including: Osteoporosis screening. Osteoporosis is a condition that causes the bones to get weaker and break more easily. Blood pressure screening. Blood pressure changes and medicines to control blood pressure can make you feel dizzy. Depression screening. You may be more likely to have a fall if you have a fear of falling, feel depressed, or feel unable to do activities that you used to do. Alcohol use screening. Using too much alcohol can affect your balance and may make you more likely to have a fall. Follow these instructions at home: Lifestyle Do not drink alcohol if: Your health care provider tells you not to drink. If you drink alcohol: Limit how much you have to: 0-1 drink a day for women. 0-2 drinks a day for men. Know how much alcohol is in your drink. In the [place] one drink equals one 12 oz bottle of beer (355 mL), one 5 oz glass of wine (148 mL), or one 1 oz glass of hard liquor (44 mL). Do not use any products that contain nicotine or tobacco. These products include cigarettes, chewing tobacco, and vaping devices, such as e-cigarettes. If you need help quitting, ask your health care provider. Activity  Follow a regular exercise program to stay fit. This will help you maintain your balance. Ask your health care provider what types of exercise are appropriate for you. If you need a cane or walker, use it as recommended by your health care provider. Wear  supportive shoes that have nonskid soles. Safety  Remove any tripping hazards, such as rugs, cords, and clutter. Install safety equipment such as grab bars in bathrooms and safety rails on stairs. Keep rooms and walkways well-lit. General instructions Talk with your health care provider about your risks for falling. Tell your health care provider if: You fall. Be sure to tell your health care  provider about all falls, even ones that seem minor. You feel dizzy, tiredness (fatigue), or off-balance. Take over-the-counter and prescription medicines only as told by your health care provider. These include supplements. Eat a healthy diet and maintain a healthy weight. A healthy diet includes low-fat dairy products, low-fat (lean) meats, and fiber from whole grains, beans, and lots of fruits and vegetables. Stay current with your vaccines. Schedule regular health, dental, and eye exams. Summary Having a healthy lifestyle and getting preventive care can help to protect your health and wellness after age 50. Screening and testing are the best way to find a health problem early and help you avoid having a fall. Early diagnosis and treatment give you the best chance for managing medical conditions that are more common for people who are older than age 59. Falls are a major cause of broken bones and head injuries in people who are older than age 71. Take precautions to prevent a fall at home. Work with your health care provider to learn what changes you can make to improve your health and wellness and to prevent falls. This information is not intended to replace advice given to you by your health care provider. Make sure you discuss any questions you have with your health care provider. Document Revised: 08/27/2020 Document Reviewed: 08/27/2020 Elsevier Patient Education  [place].

## 2021-05-07 NOTE — Progress Notes (Signed)
Patient ID: Randy Decker, male    DOB: [birth year], 66 y.o.   MRN: [id number]  This visit was conducted in person.  BP 122/70    Pulse 65    Temp 97.8 F (36.6 C) (Temporal)    Ht 5' 7.5" (1.715 m)    Wt 202 lb 9 oz (91.9 kg)    SpO2 98%    BMI 31.26 kg/m    CC: CPE Subjective:   HPI: Randy Decker is a 66 y.o. male presenting on 05/07/2021 for Medicare Wellness   Did not see health advisor this year.   Hearing Screening   500Hz  1000Hz  2000Hz  4000Hz   Right ear 25 40 40 0  Left ear 25 20 20  0  Comments: Has noticed decreased hearing  Vision Screening - Comments:: Last eye exam, 02/2021.  [facility] Office Visit from 05/07/2021 in [facility]  PHQ-2 Total Score 0       Fall Risk  05/07/2021 05/02/2020  Falls in the past year? 1 0  Comment Tripped -  Number falls in past yr: 0 0  Injury with Fall? 0 0  Follow up - Falls evaluation completed  1 fall this past year tripped while walking in the woods - no injury.   Upcoming lap cholecystectomy this week for symptomatic gallstones ([place]).  Facial rash has significantly improved. Upcoming patch test through Dr Juel Burrow office dermatologist.   Fmhx AAA (father) - pt had normal vascular screenings through [facility] 08/[facility].  Fmhx CAD - father MI age 70.  Gout - on allopurinol 100mg  daily with significant improvement of flares.  ED - sildenafil 100mg  dose unaffordable. Requests 20mg  dose.   Preventative: COLONOSCOPY Date: 2015 WNL Carlean Purl)  Prostate cancer screening - discussed, continue screening, no BPH sxs  Lung cancer screening - not eligible  Flu shot - yearly  [place] 06/2019 x2, 12/2019, bivalent booster 01/2021 Tdap 2012  Prevnar-13 2020 Hep B series 2012 zostavax 2016 shingrix - 06/2018, 11/2018  Advanced directives - discussed, no living will set up. Packet provided today. Oldest daughter would be HCPOA.  Seat belt use discussed.  Sunscreen use discussed. No changing moles on  skin.  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 Interim medical history since our last visit reviewed. Allergies and medications reviewed and updated. Outpatient Medications Prior to Visit  Medication Sig Dispense Refill   aspirin EC 81 MG tablet Take 1 tablet (81 mg total) by mouth daily. Swallow whole. 90 tablet 3   sildenafil (VIAGRA) 100 MG tablet TAKE 0.5 TO 1 TABLET BY MOUTH DAILY AS NEEDED FOR ERECTILE DYSFUNCTION (INS. COVERS 3 IN 31 DAYS). 5 tablet 11   allopurinol (ZYLOPRIM) 100 MG tablet TAKE 1 TABLET BY MOUTH EVERY DAY 90 tablet 2   amLODipine (NORVASC) 10 MG tablet Take 1 tablet (10 mg total) by mouth daily. 90 tablet 3   Colchicine (MITIGARE) 0.6 MG CAPS TAKE 2 CAPSULES BY MOUTH ON FIRST DAY THEN ONE DAILY AS NEEDED FOR GOUT  FLARE 90 capsule 0   lovastatin (MEVACOR) 40 MG tablet TAKE 1 TABLET BY MOUTH EVERYDAY AT BEDTIME 90 tablet 2   No facility-administered medications prior to visit.     Per HPI unless specifically indicated in ROS section below Review of Systems  Constitutional:  Negative for activity change, appetite change, chills, fatigue, fever and unexpected weight change.  HENT:  Negative for hearing loss.   Eyes:  Negative for visual disturbance.  Respiratory:  Negative for cough, chest tightness, shortness of breath and wheezing.   Cardiovascular:  Negative for chest pain, palpitations and leg swelling.  Gastrointestinal:  Negative for abdominal distention, abdominal pain, blood in stool,  constipation, diarrhea, nausea and vomiting.  Genitourinary:  Negative for difficulty urinating and hematuria.  Musculoskeletal:  Negative for arthralgias, myalgias and neck pain.  Skin:  Negative for rash.  Neurological:  Negative for dizziness, seizures, syncope and headaches.  Hematological:  Negative for adenopathy. Does not bruise/bleed easily.  Psychiatric/Behavioral:  Negative for dysphoric mood. The patient is not nervous/anxious.    Objective:  BP 122/70    Pulse 65    Temp 97.8 F (36.6 C) (Temporal)    Ht 5' 7.5" (1.715 m)    Wt 202 lb 9 oz (91.9 kg)    SpO2 98%    BMI 31.26 kg/m   Wt Readings from Last 3 Encounters:  05/07/21 202 lb 9 oz (91.9 kg)  04/04/21 205 lb 6.4 oz (93.2 kg)  03/06/21 206 lb (93.4 kg)      Physical Exam Vitals and nursing note reviewed.  Constitutional:      General: He is not in acute distress.    Appearance: Normal appearance. He is well-developed. He is not ill-appearing.  HENT:     Head: Normocephalic and atraumatic.     Right Ear: Hearing, tympanic membrane, ear canal and external ear normal.     Left Ear: Hearing, tympanic membrane, ear canal and external ear normal.  Eyes:     General: No scleral icterus.    Extraocular Movements: Extraocular movements intact.     Conjunctiva/sclera: Conjunctivae normal.     Pupils: Pupils are equal, round, and reactive to light.  Neck:     Thyroid: No thyroid mass or thyromegaly.     Vascular: No carotid bruit.  Cardiovascular:     Rate and Rhythm: Normal rate and regular rhythm.     Pulses: Normal pulses.          Radial pulses are 2+ on the right side and 2+ on the left side.     Heart sounds: Normal heart sounds. No murmur heard. Pulmonary:     Effort: Pulmonary effort is normal. No respiratory distress.     Breath sounds: Normal breath sounds. No wheezing, rhonchi or rales.  Abdominal:     General: Bowel sounds are normal. There is no distension.     Palpations: Abdomen is soft. There is no  mass.     Tenderness: There is no abdominal tenderness. There is no guarding or rebound.     Hernia: No hernia is present.  Musculoskeletal:        General: Normal range of motion.     Cervical back: Normal range of motion and neck supple.     Right lower leg: No edema.     Left lower leg: No edema.  Lymphadenopathy:     Cervical: No cervical adenopathy.  Skin:    General: Skin is warm and dry.  Findings: No rash.  Neurological:     General: No focal deficit present.     Mental Status: He is alert and oriented to person, place, and time.     Comments:  Recall 2/3, 3/3 with cue Calculation 4/5 DLUOW   Psychiatric:        Mood and Affect: Mood normal.        Behavior: Behavior normal.        Thought Content: Thought content normal.        Judgment: Judgment normal.      Results for orders placed or performed in visit on 03/06/21  Sedimentation rate  Result Value Ref Range   Sed Rate 14 0 - 20 mm/hr  ANA  Result Value Ref Range   Anti Nuclear Antibody (ANA) NEGATIVE NEGATIVE  Comprehensive metabolic panel  Result Value Ref Range   Sodium 139 135 - 145 mEq/L   Potassium 4.1 3.5 - 5.1 mEq/L   Chloride 103 96 - 112 mEq/L   CO2 27 19 - 32 mEq/L   Glucose, Bld 85 70 - 99 mg/dL   BUN 9 6 - 23 mg/dL   Creatinine, Ser 0.85 0.40 - 1.50 mg/dL   Total Bilirubin 0.6 0.2 - 1.2 mg/dL   Alkaline Phosphatase 51 39 - 117 U/L   AST 19 0 - 37 U/L   ALT 19 0 - 53 U/L   Total Protein 6.9 6.0 - 8.3 g/dL   Albumin 4.5 3.5 - 5.2 g/dL   GFR 90.85 >60.00 mL/min   Calcium 9.0 8.4 - 10.5 mg/dL  TSH  Result Value Ref Range   TSH 1.88 0.35 - 5.50 uIU/mL  CBC with Differential/Platelet  Result Value Ref Range   WBC 5.7 4.0 - 10.5 K/uL   RBC 4.68 4.22 - 5.81 Mil/uL   Hemoglobin 14.2 13.0 - 17.0 g/dL   HCT 42.4 39.0 - 52.0 %   MCV 90.6 78.0 - 100.0 fl   MCHC 33.4 30.0 - 36.0 g/dL   RDW 12.8 11.5 - 15.5 %   Platelets 270.0 150.0 - 400.0 K/uL   Neutrophils Relative % 63.6 43.0 - 77.0 %    Lymphocytes Relative 25.6 12.0 - 46.0 %   Monocytes Relative 7.3 3.0 - 12.0 %   Eosinophils Relative 2.2 0.0 - 5.0 %   Basophils Relative 1.3 0.0 - 3.0 %   Neutro Abs 3.6 1.4 - 7.7 K/uL   Lymphs Abs 1.5 0.7 - 4.0 K/uL   Monocytes Absolute 0.4 0.1 - 1.0 K/uL   Eosinophils Absolute 0.1 0.0 - 0.7 K/uL   Basophils Absolute 0.1 0.0 - 0.1 K/uL  CK  Result Value Ref Range   Total CK 66 7 - 232 U/L  Anti-scleroderma antibody  Result Value Ref Range   Scleroderma (Scl-70) (ENA) Antibody, IgG <1.0 NEG <1.0 NEG AI    Assessment & Plan:  This visit occurred during the SARS-CoV-2 public health emergency.  Safety protocols were in place, including screening questions prior to the visit, additional usage of staff PPE, and extensive cleaning of exam room while observing appropriate contact time as indicated for disinfecting solutions.   Problem List Items Addressed This Visit     Encounter for general adult medical examination with abnormal findings (Chronic)    Preventative protocols reviewed and updated unless pt declined. Discussed healthy diet and lifestyle.       Medicare annual wellness visit, initial - Primary (Chronic)    I have personally reviewed the Medicare Annual Wellness questionnaire  and have noted 1. The patient's medical and social history 2. Their use of alcohol, tobacco or illicit drugs 3. Their current medications and supplements 4. The patient's functional ability including ADL's, fall risks, home safety risks and hearing or visual impairment. Cognitive function has been assessed and addressed as indicated.  5. Diet and physical activity 6. Evidence for depression or mood disorders The patients weight, height, BMI have been recorded in the chart. I have made referrals, counseling and provided education to the patient based on review of the above and I have provided the pt with a written personalized care plan for preventive services. Provider list updated.. See scanned  questionairre as needed for further documentation. Reviewed preventative protocols and updated unless pt declined.       Advanced directives, counseling/discussion (Chronic)    Advanced directives - discussed, no living will set up. Packet previously provided, again provided today. Oldest daughter would be HCPOA.       HLD (hyperlipidemia)    Chronic on lovastatin - update FLP. Requests upcoming labs to be sent to cardiology.  The 10-year ASCVD risk score (Arnett DK, et al., 2019) is: 13.4%   Values used to calculate the score:     Age: 89 years     Sex: Male     Is Non-Hispanic African American: No     Diabetic: No     Tobacco smoker: No     Systolic Blood Pressure: 778 mmHg     Is BP treated: Yes     HDL Cholesterol: 51.1 mg/dL     Total Cholesterol: 176 mg/dL       Relevant Medications   amLODipine (NORVASC) 10 MG tablet   lovastatin (MEVACOR) 40 MG tablet   Other Relevant Orders   Lipid panel   Essential hypertension    Chronic, stable on current regimen - continue      Relevant Medications   amLODipine (NORVASC) 10 MG tablet   lovastatin (MEVACOR) 40 MG tablet   Severe obesity (BMI 35.0-39.9) with comorbidity ([place])    Congratulated on weight loss to date - encouraged ongoing healthy diet and lifestyle choices.       Gout    No recent gout flares - continue low dose allopurinol.       Relevant Orders   Uric acid   Cholelithiasis    Symptomatic gallstones pending lap chole by gen surgery later this month.       Facial rash    This has resolved. Autoimmune testing returned normal. Saw derm - pending patch testing.       Dilated aortic root (HCC)    Followed by cards      Relevant Medications   amLODipine (NORVASC) 10 MG tablet   lovastatin (MEVACOR) 40 MG tablet   Coronary artery calcification   Relevant Medications   amLODipine (NORVASC) 10 MG tablet   lovastatin (MEVACOR) 40 MG tablet   Other Visit Diagnoses     Special screening for malignant  neoplasm of prostate       Relevant Orders   PSA, Medicare        Meds ordered this encounter  Medications   allopurinol (ZYLOPRIM) 100 MG tablet    Sig: Take 1 tablet (100 mg total) by mouth daily.    Dispense:  90 tablet    Refill:  3   amLODipine (NORVASC) 10 MG tablet    Sig: Take 1 tablet (10 mg total) by mouth daily.    Dispense:  90 tablet  Refill:  3   lovastatin (MEVACOR) 40 MG tablet    Sig: TAKE 1 TABLET BY MOUTH EVERYDAY AT BEDTIME    Dispense:  90 tablet    Refill:  3   colchicine 0.6 MG tablet    Sig: Take 1 tablet (0.6 mg total) by mouth daily as needed (gout flare). First day of gout flare may take 2 tablets at once    Dispense:  30 tablet    Refill:  3   Orders Placed This Encounter  Procedures   Lipid panel   PSA, Medicare   Uric acid     Patient instructions: Advanced directive packet provided today.  Labs today  Good to see you today Continue regular walking routine.  You are doing well today.  I hope you have a speedy recovery from upcoming surgery!  Follow up plan: Return in about 1 year (around 05/07/2022) for annual exam, prior fasting for blood work, medicare wellness visit.  Ria Bush, MD

## 2021-05-07 NOTE — Progress Notes (Signed)
Spoke w/ via phone for pre-op interview---patient Lab needs dos----      NA         Lab results------EKG COVID test -----patient states asymptomatic no test needed Arrive at 05:30 05/10/21 NPO after MN NO Solid Food.  Clear liquids from MN until 04:30am Med rec completed Medications to take morning of surgery -----amlodipine only Diabetic medication -----NA Patient instructed to bring photo id and insurance card day of surgery Patient aware to have Driver (ride ) / caregiver    for 24 hours after surgery  Patient Special Instructions ----- Pre-Op special Istructions ----- Patient verbalized understanding of instructions that were given at this phone interview. Patient denies shortness of breath, chest pain, fever, cough at this phone interview.

## 2021-05-07 NOTE — Assessment & Plan Note (Signed)
Advanced directives - discussed, no living will set up. Packet previously provided, again provided today. Oldest daughter would be HCPOA.

## 2021-05-07 NOTE — Assessment & Plan Note (Signed)
Followed by cards

## 2021-05-07 NOTE — Assessment & Plan Note (Addendum)
Chronic on lovastatin - update FLP. Requests upcoming labs to be sent to cardiology.  The 10-year ASCVD risk score (Arnett DK, et al., 2019) is: 13.4%   Values used to calculate the score:     Age: 66 years     Sex: Male     Is Non-Hispanic African American: No     Diabetic: No     Tobacco smoker: No     Systolic Blood Pressure: 185 mmHg     Is BP treated: Yes     HDL Cholesterol: 51.1 mg/dL     Total Cholesterol: 176 mg/dL

## 2021-05-07 NOTE — Assessment & Plan Note (Signed)
Chronic, stable on current regimen - continue. 

## 2021-05-09 NOTE — Anesthesia Preprocedure Evaluation (Addendum)
Anesthesia Evaluation  Patient identified by MRN, date of birth, ID band Patient awake    Reviewed: Allergy & Precautions, NPO status , Patient's Chart, lab work & pertinent test results  History of Anesthesia Complications Negative for: history of anesthetic complications  Airway Mallampati: I  TM Distance: >3 FB Neck ROM: Full    Dental  (+) Dental Advisory Given, Teeth Intact   Pulmonary former smoker,    breath sounds clear to auscultation       Cardiovascular hypertension, Pt. on medications (-) angina Rhythm:Regular Rate:Normal  '22 ECHO: EF 60-65%, no significant valvular abnormalities   Neuro/Psych negative neurological ROS     GI/Hepatic Neg liver ROS, cholecystitis   Endo/Other  obese  Renal/GU negative Renal ROS     Musculoskeletal   Abdominal   Peds  Hematology negative hematology ROS (+)   Anesthesia Other Findings   Reproductive/Obstetrics                            Anesthesia Physical Anesthesia Plan  ASA: 2  Anesthesia Plan: General   Post-op Pain Management: Tylenol PO (pre-op)   Induction: Intravenous  PONV Risk Score and Plan: 2 and Ondansetron and Dexamethasone  Airway Management Planned: Oral ETT  Additional Equipment: None  Intra-op Plan:   Post-operative Plan: Extubation in OR  Informed Consent: I have reviewed the patients History and Physical, chart, labs and discussed the procedure including the risks, benefits and alternatives for the proposed anesthesia with the patient or authorized representative who has indicated his/her understanding and acceptance.     Dental advisory given  Plan Discussed with: CRNA and Surgeon  Anesthesia Plan Comments:        Anesthesia Quick Evaluation

## 2021-05-10 ENCOUNTER — Ambulatory Visit (HOSPITAL_BASED_OUTPATIENT_CLINIC_OR_DEPARTMENT_OTHER)
Admission: RE | Admit: 2021-05-10 | Discharge: 2021-05-10 | Disposition: A | Discharge status: Home / Self Care | Payer: PPO | Attending: Surgery | Admitting: Surgery

## 2021-05-10 ENCOUNTER — Ambulatory Visit (HOSPITAL_BASED_OUTPATIENT_CLINIC_OR_DEPARTMENT_OTHER): Payer: PPO | Admitting: Anesthesiology

## 2021-05-10 ENCOUNTER — Encounter (HOSPITAL_BASED_OUTPATIENT_CLINIC_OR_DEPARTMENT_OTHER)
Admission: RE | Disposition: A | Discharge status: Home / Self Care | Payer: Self-pay | Source: Home / Self Care | Attending: Surgery

## 2021-05-10 ENCOUNTER — Encounter (HOSPITAL_BASED_OUTPATIENT_CLINIC_OR_DEPARTMENT_OTHER): Payer: Self-pay | Admitting: Surgery

## 2021-05-10 DIAGNOSIS — K819 Cholecystitis, unspecified: Secondary | ICD-10-CM | POA: Diagnosis present

## 2021-05-10 DIAGNOSIS — K8010 Calculus of gallbladder with chronic cholecystitis without obstruction: Secondary | ICD-10-CM | POA: Diagnosis not present

## 2021-05-10 DIAGNOSIS — I10 Essential (primary) hypertension: Secondary | ICD-10-CM | POA: Diagnosis not present

## 2021-05-10 DIAGNOSIS — E669 Obesity, unspecified: Secondary | ICD-10-CM | POA: Diagnosis not present

## 2021-05-10 DIAGNOSIS — Z87891 Personal history of nicotine dependence: Secondary | ICD-10-CM | POA: Diagnosis not present

## 2021-05-10 DIAGNOSIS — Z6831 Body mass index (BMI) 31.0-31.9, adult: Secondary | ICD-10-CM | POA: Diagnosis not present

## 2021-05-10 HISTORY — PX: CHOLECYSTECTOMY: SHX55

## 2021-05-10 SURGERY — LAPAROSCOPIC CHOLECYSTECTOMY
Anesthesia: General | Site: Abdomen

## 2021-05-10 MED ORDER — ONDANSETRON HCL 4 MG/2ML IJ SOLN
INTRAMUSCULAR | Status: AC
  Filled 2021-05-10: qty 2

## 2021-05-10 MED ORDER — CEFAZOLIN SODIUM-DEXTROSE 2-4 GM/100ML-% IV SOLN
INTRAVENOUS | Status: AC
  Filled 2021-05-10: qty 100

## 2021-05-10 MED ORDER — MIDAZOLAM HCL 2 MG/2ML IJ SOLN
INTRAMUSCULAR | Status: AC
  Filled 2021-05-10: qty 2

## 2021-05-10 MED ORDER — EPHEDRINE SULFATE (PRESSORS) 50 MG/ML IJ SOLN
INTRAMUSCULAR | Status: DC | PRN
  Administered 2021-05-10: 10 mg via INTRAVENOUS

## 2021-05-10 MED ORDER — 0.9 % SODIUM CHLORIDE (POUR BTL) OPTIME
TOPICAL | Status: DC | PRN
  Administered 2021-05-10: 500 mL

## 2021-05-10 MED ORDER — ROCURONIUM BROMIDE 10 MG/ML (PF) SYRINGE
PREFILLED_SYRINGE | INTRAVENOUS | Status: AC
  Filled 2021-05-10: qty 10

## 2021-05-10 MED ORDER — HYDROMORPHONE HCL 1 MG/ML IJ SOLN
INTRAMUSCULAR | Status: AC
  Filled 2021-05-10: qty 1

## 2021-05-10 MED ORDER — OXYCODONE HCL 5 MG/5ML PO SOLN: 5.0000 mg | Freq: Once | ORAL | Status: DC | PRN

## 2021-05-10 MED ORDER — LIDOCAINE 2% (20 MG/ML) 5 ML SYRINGE
INTRAMUSCULAR | Status: DC | PRN
  Administered 2021-05-10: 40 mg via INTRAVENOUS

## 2021-05-10 MED ORDER — PROMETHAZINE HCL 25 MG/ML IJ SOLN: 6.2500 mg | INTRAMUSCULAR | Status: DC | PRN

## 2021-05-10 MED ORDER — ACETAMINOPHEN 500 MG PO TABS
1000.0000 mg | ORAL_TABLET | ORAL | Status: AC
  Administered 2021-05-10: 1000 mg via ORAL

## 2021-05-10 MED ORDER — CELECOXIB 200 MG PO CAPS
400.0000 mg | ORAL_CAPSULE | ORAL | Status: AC
  Administered 2021-05-10: 400 mg via ORAL

## 2021-05-10 MED ORDER — MIDAZOLAM HCL 5 MG/5ML IJ SOLN
INTRAMUSCULAR | Status: DC | PRN
  Administered 2021-05-10: 2 mg via INTRAVENOUS

## 2021-05-10 MED ORDER — FENTANYL CITRATE (PF) 100 MCG/2ML IJ SOLN
INTRAMUSCULAR | Status: DC | PRN
  Administered 2021-05-10: 100 ug via INTRAVENOUS

## 2021-05-10 MED ORDER — MEPERIDINE HCL 25 MG/ML IJ SOLN: 6.2500 mg | INTRAMUSCULAR | Status: DC | PRN

## 2021-05-10 MED ORDER — ONDANSETRON HCL 4 MG/2ML IJ SOLN
INTRAMUSCULAR | Status: DC | PRN
  Administered 2021-05-10: 4 mg via INTRAVENOUS

## 2021-05-10 MED ORDER — DEXAMETHASONE SODIUM PHOSPHATE 10 MG/ML IJ SOLN
INTRAMUSCULAR | Status: AC
  Filled 2021-05-10: qty 1

## 2021-05-10 MED ORDER — BUPIVACAINE LIPOSOME 1.3 % IJ SUSP: 20.0000 mL | Freq: Once | INTRAMUSCULAR | Status: DC

## 2021-05-10 MED ORDER — FENTANYL CITRATE (PF) 100 MCG/2ML IJ SOLN
INTRAMUSCULAR | Status: AC
  Filled 2021-05-10: qty 2

## 2021-05-10 MED ORDER — ROCURONIUM BROMIDE 10 MG/ML (PF) SYRINGE
PREFILLED_SYRINGE | INTRAVENOUS | Status: DC | PRN
  Administered 2021-05-10: 60 mg via INTRAVENOUS

## 2021-05-10 MED ORDER — PROPOFOL 10 MG/ML IV BOLUS
INTRAVENOUS | Status: DC | PRN
  Administered 2021-05-10: 130 mg via INTRAVENOUS

## 2021-05-10 MED ORDER — EPHEDRINE 5 MG/ML INJ
INTRAVENOUS | Status: AC
  Filled 2021-05-10: qty 5

## 2021-05-10 MED ORDER — CELECOXIB 200 MG PO CAPS
ORAL_CAPSULE | ORAL | Status: AC
  Filled 2021-05-10: qty 2

## 2021-05-10 MED ORDER — CEFAZOLIN SODIUM-DEXTROSE 2-4 GM/100ML-% IV SOLN
2.0000 g | INTRAVENOUS | Status: AC
  Administered 2021-05-10: 2 g via INTRAVENOUS

## 2021-05-10 MED ORDER — OXYCODONE-ACETAMINOPHEN 5-325 MG PO TABS
1.0000 | ORAL_TABLET | ORAL | 0 refills | Status: AC | PRN
Start: 2021-05-10 — End: 2022-05-10

## 2021-05-10 MED ORDER — HYDROMORPHONE HCL 1 MG/ML IJ SOLN
0.2500 mg | INTRAMUSCULAR | Status: DC | PRN
  Administered 2021-05-10 (×2): 0.5 mg via INTRAVENOUS

## 2021-05-10 MED ORDER — HEMOSTATIC AGENTS (NO CHARGE) OPTIME
TOPICAL | Status: DC | PRN
Start: 2021-05-10 — End: 2021-05-10
  Administered 2021-05-10: 1 via TOPICAL

## 2021-05-10 MED ORDER — SUGAMMADEX SODIUM 200 MG/2ML IV SOLN
INTRAVENOUS | Status: DC | PRN
  Administered 2021-05-10: 180 mg via INTRAVENOUS

## 2021-05-10 MED ORDER — DEXAMETHASONE SODIUM PHOSPHATE 10 MG/ML IJ SOLN
INTRAMUSCULAR | Status: DC | PRN
  Administered 2021-05-10: 10 mg via INTRAVENOUS

## 2021-05-10 MED ORDER — LACTATED RINGERS IV SOLN: INTRAVENOUS | Status: DC

## 2021-05-10 MED ORDER — OXYCODONE HCL 5 MG PO TABS: 5.0000 mg | ORAL_TABLET | Freq: Once | ORAL | Status: DC | PRN

## 2021-05-10 MED ORDER — CHLORHEXIDINE GLUCONATE CLOTH 2 % EX PADS: 6.0000 | MEDICATED_PAD | Freq: Once | CUTANEOUS | Status: DC

## 2021-05-10 MED ORDER — ACETAMINOPHEN 500 MG PO TABS
ORAL_TABLET | ORAL | Status: AC
  Filled 2021-05-10: qty 2

## 2021-05-10 MED ORDER — PROPOFOL 500 MG/50ML IV EMUL
INTRAVENOUS | Status: AC
  Filled 2021-05-10: qty 50

## 2021-05-10 MED ORDER — MIDAZOLAM HCL 2 MG/2ML IJ SOLN: 0.5000 mg | Freq: Once | INTRAMUSCULAR | Status: DC | PRN

## 2021-05-10 MED ORDER — ACETAMINOPHEN 500 MG PO TABS: 1000.0000 mg | ORAL_TABLET | Freq: Once | ORAL | Status: DC

## 2021-05-10 MED ORDER — BUPIVACAINE HCL 0.25 % IJ SOLN
INTRAMUSCULAR | Status: DC | PRN
  Administered 2021-05-10: 4 mL

## 2021-05-10 SURGICAL SUPPLY — 47 items
ADH SKN CLS APL DERMABOND .7 (GAUZE/BANDAGES/DRESSINGS) ×1
APL PRP STRL LF DISP 70% ISPRP (MISCELLANEOUS) ×1
APPLIER CLIP 5 13 M/L LIGAMAX5 (MISCELLANEOUS) ×2
APR CLP MED LRG 5 ANG JAW (MISCELLANEOUS) ×1
BAG SPEC RTRVL 10 TROC 200 (ENDOMECHANICALS) ×1
BAG SPEC RTRVL LRG 6X4 10 (ENDOMECHANICALS)
CABLE HIGH FREQUENCY MONO STRZ (ELECTRODE) ×2 IMPLANT
CATH URET 5FR 28IN OPEN ENDED (CATHETERS) IMPLANT
CHLORAPREP W/TINT 26 (MISCELLANEOUS) ×2 IMPLANT
CLIP APPLIE 5 13 M/L LIGAMAX5 (MISCELLANEOUS) ×1 IMPLANT
COVER MAYO STAND STRL (DRAPES) ×2 IMPLANT
DERMABOND ADVANCED (GAUZE/BANDAGES/DRESSINGS) ×1
DERMABOND ADVANCED .7 DNX12 (GAUZE/BANDAGES/DRESSINGS) ×1 IMPLANT
DRAPE C-ARM 42X120 X-RAY (DRAPES) ×2 IMPLANT
ELECT REM PT RETURN 9FT ADLT (ELECTROSURGICAL) ×2
ELECTRODE REM PT RTRN 9FT ADLT (ELECTROSURGICAL) ×1 IMPLANT
ENDOLOOP SUT PDS II  0 18 (SUTURE) ×1
ENDOLOOP SUT PDS II 0 18 (SUTURE) IMPLANT
GAUZE 4X4 16PLY ~~LOC~~+RFID DBL (SPONGE) ×2 IMPLANT
GLOVE SRG 8 PF TXTR STRL LF DI (GLOVE) ×1 IMPLANT
GLOVE SURG ENC MOIS LTX SZ7.5 (GLOVE) ×2 IMPLANT
GLOVE SURG UNDER POLY LF SZ8 (GLOVE) ×2
GOWN STRL REUS W/TWL LRG LVL3 (GOWN DISPOSABLE) ×6 IMPLANT
GRASPER SUT TROCAR 14GX15 (MISCELLANEOUS) IMPLANT
HEMOSTAT SNOW SURGICEL 2X4 (HEMOSTASIS) ×1 IMPLANT
IV CATH 14GX2 1/4 (CATHETERS) IMPLANT
KIT TURNOVER CYSTO (KITS) ×2 IMPLANT
NDL INSUFFLATION 14GA 120MM (NEEDLE) ×1 IMPLANT
NEEDLE INSUFFLATION 14GA 120MM (NEEDLE) ×2 IMPLANT
NS IRRIG 1000ML POUR BTL (IV SOLUTION) ×2 IMPLANT
PACK BASIN DAY SURGERY FS (CUSTOM PROCEDURE TRAY) ×4 IMPLANT
PAD ARMBOARD 7.5X6 YLW CONV (MISCELLANEOUS) ×2 IMPLANT
POUCH RETRIEVAL ECOSAC 10 (ENDOMECHANICALS) ×1 IMPLANT
POUCH RETRIEVAL ECOSAC 10MM (ENDOMECHANICALS) ×1
POUCH SPECIMEN RETRIEVAL 10MM (ENDOMECHANICALS) IMPLANT
SCISSORS LAP 5X35 DISP (ENDOMECHANICALS) ×2 IMPLANT
SET IRRIG TUBING LAPAROSCOPIC (IRRIGATION / IRRIGATOR) ×1 IMPLANT
SET TUBE SMOKE EVAC HIGH FLOW (TUBING) ×2 IMPLANT
SPONGE T-LAP 18X18 ~~LOC~~+RFID (SPONGE) ×1 IMPLANT
STOPCOCK 4 WAY LG BORE MALE ST (IV SETS) IMPLANT
SUT MNCRL AB 4-0 PS2 18 (SUTURE) ×3 IMPLANT
TOWEL OR 17X26 10 PK STRL BLUE (TOWEL DISPOSABLE) ×2 IMPLANT
TRAY LAPAROSCOPIC (CUSTOM PROCEDURE TRAY) ×2 IMPLANT
TROCAR XCEL NON-BLD 11X100MML (ENDOMECHANICALS) ×2 IMPLANT
TROCAR XCEL NON-BLD 5MMX100MML (ENDOMECHANICALS) ×6 IMPLANT
WARMER LAPAROSCOPE (MISCELLANEOUS) ×2 IMPLANT
WATER STERILE IRR 1000ML POUR (IV SOLUTION) ×2 IMPLANT

## 2021-05-10 NOTE — Anesthesia Postprocedure Evaluation (Signed)
Anesthesia Post Note  Patient: ARGEL PABLO  Procedure(s) Performed: LAPAROSCOPIC CHOLECYSTECTOMY (Abdomen)     Patient location during evaluation: PACU Anesthesia Type: General Level of consciousness: awake and alert, patient cooperative and oriented Pain management: pain level controlled Vital Signs Assessment: post-procedure vital signs reviewed and stable Respiratory status: spontaneous breathing, nonlabored ventilation and respiratory function stable Cardiovascular status: blood pressure returned to baseline and stable Postop Assessment: no apparent nausea or vomiting and adequate PO intake Anesthetic complications: no   No notable events documented.  Last Vitals:  Vitals:   05/10/21 0900 05/10/21 0915  BP: 117/70 115/71  Pulse: 70 68  Resp: 18 16  Temp:  36.5 C  SpO2: 98% 95%    Last Pain:  Vitals:   05/10/21 0915  TempSrc:   PainSc: 4                  Salayah Meares,E. Deshana Rominger

## 2021-05-10 NOTE — Anesthesia Procedure Notes (Signed)
Procedure Name: Intubation Date/Time: 05/10/2021 7:37 AM Performed by: Bonney Aid, CRNA Pre-anesthesia Checklist: Patient identified, Emergency Drugs available, Suction available and Patient being monitored Patient Re-evaluated:Patient Re-evaluated prior to induction Oxygen Delivery Method: Circle system utilized Preoxygenation: Pre-oxygenation with 100% oxygen Induction Type: IV induction Ventilation: Mask ventilation without difficulty Laryngoscope Size: Mac and 4 Grade View: Grade II Tube type: Oral Number of attempts: 1 Airway Equipment and Method: Stylet Placement Confirmation: ETT inserted through vocal cords under direct vision, positive ETCO2 and breath sounds checked- equal and bilateral Tube secured with: Tape Dental Injury: Teeth and Oropharynx as per pre-operative assessment

## 2021-05-10 NOTE — Op Note (Addendum)
Patient: Randy Decker (10/08/1955, [id number])  Date of Surgery: 05/10/2021   Preoperative Diagnosis: CHOLECYSTITIS   Postoperative Diagnosis: CHOLECYSTITIS   Surgical Procedure: LAPAROSCOPIC CHOLECYSTECTOMY:    Operative Team Members:  Surgeon(s) and Role:    * Samreen Seltzer, Nickola Major, MD - Primary  Radonna Ricker, MD - [facility] Resident Assistant  Anesthesiologist: Annye Asa, MD CRNA: Bonney Aid, CRNA   Anesthesia: General   Fluids:  Total I/O In: 100 [IV [id number]] Out: -   Complications: None  Drains:  none   Specimen:  ID Type Source Tests Collected by Time Destination  1 : gallbladder Tissue PATH Gallbladder SURGICAL PATHOLOGY Anneke Cundy, Nickola Major, MD 05/10/2021 0818      Disposition:  PACU - hemodynamically stable.  Plan of Care: Discharge to home after PACU    Indications for Procedure: Randy Decker is a 66 y.o. male who presented with abdominal pain.  History, physical and imaging was concerning for cholecystitis.  Laparoscopic cholecystectomy was recommended for the patient.  The procedure itself, as well as the risks, benefits and alternatives were discussed with the patient.  Risks discussed included but were not limited to the risk of infection, bleeding, damage to nearby structures, need to convert to open procedure, incisional hernia, bile leak, common bile duct injury and the need for additional procedures or surgeries.  With this discussion complete and all questions answered the patient granted consent to proceed.  Findings: Inflamed gallbladder with gallstones  Infection status: Patient: Private Patient Elective Case Case: Elective Infection Present At Time Of Surgery (PATOS): None   Description of Procedure:   On the date stated above, the patient was taken to the operating room suite and placed in supine positioning.  Sequential compression devices were placed on the lower extremities to prevent blood clots.   General endotracheal anesthesia was induced. Preoperative antibiotics (cefazolin) were given within 30 minutes of incision.  The patient's abdomen was prepped and draped in the usual sterile fashion.  A time-out was completed verifying the correct patient, procedure, positioning and equipment needed for the case.  We began by anesthetizing the skin with local anesthetic and then making a 5 mm incision just below the umbilicus.  We dissected through the subcutaneous tissues to the fascia.  The fascia was grasped and elevated using a Kocher clamp.  A Veress needle was inserted into the abdomen and the abdomen was insufflated to 15 mmHg.  A 5 mm trocar was inserted in this position under optical guidance and then the abdomen was inspected.  There was no trauma to the underlying viscera with initial trocar placement.  Any abnormal findings, other than inflammation in the right upper quadrant, are listed above in the findings section.  Three additional trocars were placed, one 12 mm trocar in the subxiphoid position, one 5 mm trocar in the midline epigastric area and one 52mm trocar in the right upper quadrant subcostally.  These were placed under direct vision without any trauma to the underlying viscera.    The patient was then placed in head up, left side down positioning.  The gallbladder was identified and dissected free from its attachments to the omentum allowing the duodenum to fall away.  The infundibulum of the gallbladder was dissected free working laterally to medially.  The cystic duct and cystic artery were dissected free from surrounding connective tissue.  The infundibulum of the gallbladder was dissected off the cystic plate.  A critical view of safety was obtained with the cystic duct  and cystic artery being cleared of connective tissues and clearly the only two structures entering into the gallbladder with the liver clearly visible behind.  Clips were then applied to the cystic duct and cystic  artery and then these structures were divided.  The gallbladder was dissected off the cystic plate, placed in an endocatch bag and removed from the 12 mm subxiphoid port site.  The clips were inspected and appeared effective.  The cystic plate was inspected and hemostasis was obtained using electrocautery.  A raytec was introduced into the abdomen and the operative field was blotted dry, then the raytec was removed.  Emogene Morgan was placed in the gallbladder fossa.  Attention was turned to closure.  The 12 mm subxiphoid port site was closed using a 0-vicryl suture at the fascial level.  The abdomen was desufflated.  The skin was closed using 4-0 monocryl and dermabond.  All sponge and needle counts were correct at the conclusion of the case.    Louanna Raw, MD General, Bariatric, & Minimally Invasive Surgery [facility], [place]

## 2021-05-10 NOTE — Transfer of Care (Signed)
Immediate Anesthesia Transfer of Care Note  Patient: Randy Decker  Procedure(s) Performed: LAPAROSCOPIC CHOLECYSTECTOMY (Abdomen)  Patient Location: PACU  Anesthesia Type:General  Level of Consciousness: awake, alert  and oriented  Airway & Oxygen Therapy: Patient Spontanous Breathing and Patient connected to nasal cannula oxygen  Post-op Assessment: Report given to RN  Post vital signs: Reviewed and stable  Last Vitals:  Vitals Value Taken Time  BP 111/70 05/10/21 0846  Temp    Pulse 68 05/10/21 0847  Resp 18 05/10/21 0847  SpO2 99 % 05/10/21 0847  Vitals shown include unvalidated device data.  Last Pain:  Vitals:   05/10/21 0607  TempSrc: Oral  PainSc: 0-No pain      Patients Stated Pain Goal: 5 (99/24/26 8341)  Complications: No notable events documented.

## 2021-05-10 NOTE — H&P (Signed)
Admitting Physician: Nickola Major Kasi Lasky  Service: General Surgery  CC: Gallbladder problems  Subjective   HPI: Randy Decker is an 66 y.o. male who is here for elective laparoscopic cholecystectomy.  Past Medical History:  Diagnosis Date   Essential hypertension 02/27/2015   H/O measles    possible--can't remember   History of chicken pox    HLD (hyperlipidemia)    Obesity, Class I, BMI 30-34.9 02/27/2015    Past Surgical History:  Procedure Laterality Date   COLONOSCOPY  2015   WNL Carlean Purl)   TONSILLECTOMY AND ADENOIDECTOMY  1610   UMBILICAL HERNIA REPAIR  2013    Family History  Problem Relation Age of Onset   Hypertension Father    Aortic aneurysm Father 15       deceased   Diabetes Neg Hx    Cancer Neg Hx    Stroke Neg Hx    Colon cancer Neg Hx     Social:  reports that he quit smoking about 38 years ago. His smoking use included cigarettes. He started smoking about 43 years ago. He has a 5.00 pack-year smoking history. He has never used smokeless tobacco. He reports current alcohol use. He reports that he does not use drugs.  Allergies: No Known Allergies  Medications: Current Outpatient Medications  Medication Instructions   allopurinol (ZYLOPRIM) 100 mg, Oral, Daily   amLODipine (NORVASC) 10 mg, Oral, Daily   aspirin EC 81 mg, Oral, Daily, Swallow whole.   colchicine 0.6 mg, Oral, Daily PRN, First day of gout flare may take 2 tablets at once   lovastatin (MEVACOR) 40 MG tablet TAKE 1 TABLET BY MOUTH EVERYDAY AT BEDTIME   sildenafil (VIAGRA) 100 MG tablet TAKE 0.5 TO 1 TABLET BY MOUTH DAILY AS NEEDED FOR ERECTILE DYSFUNCTION (INS. COVERS 3 IN 31 DAYS).    ROS - all of the below systems have been reviewed with the patient and positives are indicated with bold text General: chills, fever or night sweats Eyes: blurry vision or double vision ENT: epistaxis or sore throat Allergy/Immunology: itchy/watery eyes or nasal  congestion Hematologic/Lymphatic: bleeding problems, blood clots or swollen lymph nodes Endocrine: temperature intolerance or unexpected weight changes Breast: new or changing breast lumps or nipple discharge Resp: cough, shortness of breath, or wheezing CV: chest pain or dyspnea on exertion GI: as per HPI GU: dysuria, trouble voiding, or hematuria MSK: joint pain or joint stiffness Neuro: TIA or stroke symptoms Derm: pruritus and skin lesion changes Psych: anxiety and depression  Objective   PE Blood pressure 128/87, pulse 75, temperature 98.6 F (37 C), temperature source Oral, resp. rate 17, height 5' 7.5" (1.715 m), weight 91.6 kg, SpO2 98 %. Constitutional: NAD; conversant; no deformities Eyes: Moist conjunctiva; no lid lag; anicteric; PERRL Neck: Trachea midline; no thyromegaly Lungs: Normal respiratory effort; no tactile fremitus CV: RRR; no palpable thrills; no pitting edema GI: Abd Soft, nontender; no palpable hepatosplenomegaly MSK: Normal range of motion of extremities; no clubbing/cyanosis Psychiatric: Appropriate affect; alert and oriented x3 Lymphatic: No palpable cervical or axillary lymphadenopathy  No results found for this or any previous visit (from the past 24 hour(s)).  Imaging Orders  No imaging studies ordered today  RUQ [place] 11/06/20 1. Multiple gallstones without findings of acute cholecystitis. No biliary dilatation. 2. Incidental 2 cm parapelvic cyst in the left kidney.   Assessment and Plan   Randy Decker is an 66 y.o. male with calculus of gallbladder with chronic cholecystitis without obstruction  I  recommended laparoscopic cholecystectomy. The procedure itself as well as its risk, benefits, and alternatives were discussed with the patient. All questions were answered and the patient granted consent to proceed. We will proceed as scheduled.  Felicie Morn, MD  [facility] Use [email or web address] to contact on call  provider

## 2021-05-10 NOTE — Discharge Instructions (Addendum)
CHOLECYSTECTOMY POST OPERATIVE INSTRUCTIONS  Thinking Clearly  The anesthesia may cause you to feel different for 1 or 2 days. Do not drive, drink alcohol, or make any big decisions for at least 2 days.  Nutrition When you wake up, you will be able to drink small amounts of liquid. If you do not feel sick, you can slowly advance your diet to regular foods. Continue to drink lots of fluids, usually about 8 to 10 glasses per day. Eat a high-fiber diet so you dont strain during bowel movements. High-Fiber Foods Foods high in fiber include beans, bran cereals and whole-grain breads, peas, dried fruit (figs, apricots, and dates), raspberries, blackberries, strawberries, sweet corn, broccoli, baked potatoes with skin, plums, pears, apples, greens, and nuts. Activity Slowly increase your activity. Be sure to get up and walk every hour or so to prevent blood clots. No heavy lifting or strenuous activity for 4 weeks following surgery to prevent hernias at your incision sites It is normal to feel tired. You may need more sleep than usual.  Get your rest but make sure to get up and move around frequently to prevent blood clots and pneumonia.  Work and Return to [employer] can go back to work when you feel well enough. Discuss the timing with your surgeon. You can usually go back to school or work 1 week after an operation. If your work requires heavy lifting or strenuous activity you need to be placed on light duty for 4 weeks following surgery. You can return to gym class, sports or other physical activities 4 weeks after surgery.  Wound Care Always wash your hands before and after touching near your incision site. Do not soak in a bathtub until cleared at your follow up appointment. You may take a shower 24 hours after surgery. A small amount of drainage from the incision is normal. If the drainage is thick and yellow or the site is red, you may have an infection, so call your surgeon. If you  have a drain in one of your incisions, it will be taken out in office when the drainage stops. Steri-Strips will fall off in 7 to 10 days or they will be removed during your first office visit. If you have dermabond glue covering over the incision, allow the glue to flake off on its own. Avoid wearing tight or rough clothing. It may rub your incisions and make it harder for them to heal. Protect the new skin, especially from the sun. The sun can burn and cause darker scarring. Your scar will heal in about 4 to 6 weeks and will become softer and continue to fade over the next year.  The cosmetic appearance of the incisions will improve over the course of the first year after surgery. Sensation around your incision will return in a few weeks or months.  Bowel Movements After intestinal surgery, you may have loose watery stools for several days. If watery diarrhea lasts longer than 3 days, contact your surgeon. Pain medication (narcotics) can cause constipation. Increase the fiber in your diet with high-fiber foods if you are constipated. You can take an over the counter stool softener like Colace to avoid constipation.  Additional over the counter medications can also be used if Colace isn't sufficient (for example, Milk of Magnesia or Miralax).  Pain The amount of pain is different for each person. Some people need only 1 to 3 doses of pain control medication, while others need more. Take alternating doses of tylenol  and ibuprofen around the clock for the first five days following surgery.  This will provide a baseline of pain control and help with inflammation.  Take the narcotic pain medication in addition if needed for severe pain.  Contact Your Surgeon at [phone], if you have: Pain in your right upper abdomen like a gallbladder attack. Pain that will not go away Pain that gets worse A fever of more than 101F (38.3C) Repeated vomiting Swelling, redness, bleeding, or bad-smelling  drainage from your wound site Strong abdominal pain No bowel movement or unable to pass gas for 3 days Watery diarrhea lasting longer than 3 days  Pain Control The goal of pain control is to minimize pain, keep you moving and help you heal. Your surgical team will work with you on your pain plan. Most often a combination of therapies and medications are used to control your pain. You may also be given medication (local anesthetic) at the surgical site. This may help control your pain for several days. Extreme pain puts extra stress on your body at a time when your body needs to focus on healing. Do not wait until your pain has reached a level 10 or is unbearable before telling your doctor or nurse. It is much easier to control pain before it becomes severe. Following a laparoscopic procedure, pain is sometimes felt in the shoulder. This is due to the gas inserted into your abdomen during the procedure. Moving and walking helps to decrease the gas and the right shoulder pain.  Use the guide below for ways to manage your post-operative pain. Learn more by going to facs.org/safepaincontrol.  How Intense Is My Pain Common Therapies to Feel Better       I hardly notice my pain, and it does not interfere with my activities.  I notice my pain and it distracts me, but I can still do activities (sitting up, walking, standing).  Non-Medication Therapies  Ice (in a bag, applied over clothing at the surgical site), elevation, rest, meditation, massage, distraction (music, TV, play) walking and mild exercise Splinting the abdomen with pillows +  Non-Opioid Medications Acetaminophen (Tylenol) Non-steroidal anti-inflammatory drugs (NSAIDS) Aspirin, Ibuprofen (Motrin, Advil) Naproxen (Aleve) Take these as needed, when you feel pain. Both acetaminophen and NSAIDs help to decrease pain and swelling (inflammation).      My pain is hard to ignore and is more noticeable even when I rest.  My  pain interferes with my usual activities.  Non-Medication Therapies  +  Non-Opioid medications  Take on a regular schedule (around-the-clock) instead of as needed. (For example, Tylenol every 6 hours at 9:00 am, 3:00 pm, 9:00 pm, 3:00 am and Motrin every 6 hours at 12:00 am, 6:00 am, 12:00 pm, 6:00 pm)         I am focused on my pain, and I am not doing my daily activities.  I am groaning in pain, and I cannot sleep. I am unable to do anything.  My pain is as bad as it could be, and nothing else matters.  Non-Medication Therapies  +  Around-the-Clock Non-Opioid Medications  +  Short-acting opioids  Opioids should be used with other medications to manage severe pain. Opioids block pain and give a feeling of euphoria (feel high). Addiction, a serious side effect of opioids, is rare with short-term (a few days) use.  Examples of short-acting opioids include: Tramadol (Ultram), Hydrocodone (Norco, Vicodin), Hydromorphone (Dilaudid), Oxycodone (Oxycontin)     The above directions have been adapted from  the [employer] Surgical Patient Education Program.  Please refer to the ACS website if needed: PureLie.ch.ashx.   Randy Raw, MD [facility] [address] [place], [place], [place]  [address] ?  [address], [place], [place]   [address] [phone] ? [phone] ? FAX [phone] Web site: [email or web address]   Post Anesthesia Home Care Instructions  Activity: Get plenty of rest for the remainder of the day. A responsible individual must stay with you for 24 hours following the procedure.  For the next 24 hours, DO NOT: -Drive a car -Paediatric nurse -Drink alcoholic beverages -Take any medication unless instructed by your physician -Make any legal decisions or sign important papers.  Meals: Start with liquid foods such as gelatin or  soup. Progress to regular foods as tolerated. Avoid greasy, spicy, heavy foods. If nausea and/or vomiting occur, drink only clear liquids until the nausea and/or vomiting subsides. Call your physician if vomiting continues.  Special Instructions/Symptoms: Your throat may feel dry or sore from the anesthesia or the breathing tube placed in your throat during surgery. If this causes discomfort, gargle with warm salt water. The discomfort should disappear within 24 hours.  No Tylenol or acetaminophen until after 12pm today if needed.  No ibuprofen, Advil, Aleve, Motrin, ketorolac, meloxicam, naproxen, or other NSAIDS until after 12pm today if needed.

## 2021-05-13 ENCOUNTER — Encounter (HOSPITAL_BASED_OUTPATIENT_CLINIC_OR_DEPARTMENT_OTHER): Payer: Self-pay | Admitting: Surgery

## 2021-05-13 LAB — SURGICAL PATHOLOGY

## 2021-05-27 DIAGNOSIS — L258 Unspecified contact dermatitis due to other agents: Secondary | ICD-10-CM | POA: Diagnosis not present

## 2021-06-05 ENCOUNTER — Other Ambulatory Visit: Payer: Self-pay

## 2021-06-05 ENCOUNTER — Ambulatory Visit (HOSPITAL_COMMUNITY): Payer: PPO | Attending: Internal Medicine

## 2021-06-05 DIAGNOSIS — E785 Hyperlipidemia, unspecified: Secondary | ICD-10-CM | POA: Insufficient documentation

## 2021-06-05 DIAGNOSIS — I340 Nonrheumatic mitral (valve) insufficiency: Secondary | ICD-10-CM | POA: Diagnosis not present

## 2021-06-05 DIAGNOSIS — I77810 Thoracic aortic ectasia: Secondary | ICD-10-CM | POA: Diagnosis not present

## 2021-06-05 DIAGNOSIS — I10 Essential (primary) hypertension: Secondary | ICD-10-CM | POA: Diagnosis not present

## 2021-06-05 LAB — ECHOCARDIOGRAM COMPLETE
Area-P 1/2: 2.64 cm2
S' Lateral: 3.4 cm

## 2021-07-13 ENCOUNTER — Other Ambulatory Visit: Payer: Self-pay | Admitting: Family Medicine

## 2021-08-11 ENCOUNTER — Other Ambulatory Visit: Payer: Self-pay | Admitting: Family Medicine

## 2021-08-12 NOTE — Telephone Encounter (Signed)
Colchicine ?Last filled 07/22/21, #30 ?Last OV: 05/07/21, AWV ?Next OV:  05/09/22, AWV ?

## 2021-08-12 NOTE — Telephone Encounter (Signed)
ERx 

## 2021-12-01 ENCOUNTER — Other Ambulatory Visit: Payer: Self-pay | Admitting: Family Medicine

## 2021-12-02 ENCOUNTER — Encounter: Payer: Self-pay | Admitting: Family Medicine

## 2021-12-02 ENCOUNTER — Ambulatory Visit (INDEPENDENT_AMBULATORY_CARE_PROVIDER_SITE_OTHER): Payer: PPO | Admitting: Family Medicine

## 2021-12-02 VITALS — BP 132/80 | HR 71 | Temp 97.8°F | Ht 67.5 in | Wt 197.1 lb

## 2021-12-02 DIAGNOSIS — L989 Disorder of the skin and subcutaneous tissue, unspecified: Secondary | ICD-10-CM | POA: Diagnosis not present

## 2021-12-02 DIAGNOSIS — R21 Rash and other nonspecific skin eruption: Secondary | ICD-10-CM | POA: Diagnosis not present

## 2021-12-02 NOTE — Assessment & Plan Note (Signed)
R anterior thigh rash consistent with skin changes from recurrent scratching. He has fluticasone propionate 0.05% cream at home he will use to area. Discussed steroid precautions.

## 2021-12-02 NOTE — Progress Notes (Signed)
Patient ID: Randy Decker, male    DOB: 1955-10-19, 66 y.o.   MRN: [id number]  This visit was conducted in person.  BP 132/80   Pulse 71   Temp 97.8 F (36.6 C) (Temporal)   Ht 5' 7.5" (1.715 m)   Wt 197 lb 2 oz (89.4 kg)   SpO2 96%   BMI 30.42 kg/m    CC: bump to R scalp Subjective:   HPI: Randy Decker is a 66 y.o. male presenting on 12/02/2021 for Mass (C/o bump on R side of scalp. Scratched area which bled. Denies any irritation/pain in the area. Noticed about 1 mo ago. )   5 lbs down - trying to walk 2.5 hours daily - less in the recent heat.   Noted bump to R scalp present for over a month. Bleeds when he scratches it.   Not really itchy or tender.  Notes pruritis to R anterior thigh.   Also with rash consisting of itchy bumps R>L lower legs.      Relevant past medical, surgical, family and social history reviewed and updated as indicated. Interim medical history since our last visit reviewed. Allergies and medications reviewed and updated. Outpatient Medications Prior to Visit  Medication Sig Dispense Refill   allopurinol (ZYLOPRIM) 100 MG tablet Take 1 tablet (100 mg total) by mouth daily. 90 tablet 3   amLODipine (NORVASC) 10 MG tablet Take 1 tablet (10 mg total) by mouth daily. 90 tablet 3   aspirin EC 81 MG tablet Take 1 tablet (81 mg total) by mouth daily. Swallow whole. 90 tablet 3   colchicine 0.6 MG tablet TAKE 1 TABLET BY MOUTH DAILY AS NEEDED (GOUT FLARE). 1ST DAY OF GOUT FLARE MAY TAKE 2 TABLETS @ ONCE 90 tablet 0   fluticasone (CUTIVATE) 0.05 % cream Apply topically daily as needed.     lovastatin (MEVACOR) 40 MG tablet TAKE 1 TABLET BY MOUTH EVERYDAY AT BEDTIME 90 tablet 3   oxyCODONE-acetaminophen (PERCOCET) 5-325 MG tablet Take 1 tablet by mouth every 4 (four) hours as needed for severe pain. 10 tablet 0   sildenafil (VIAGRA) 100 MG tablet TAKE 0.5 TO 1 TABLET BY MOUTH DAILY AS NEEDED FOR ERECTILE DYSFUNCTION (INS. COVERS 3 IN 31 DAYS). 5 tablet  11   No facility-administered medications prior to visit.     Per HPI unless specifically indicated in ROS section below Review of Systems  Objective:  BP 132/80   Pulse 71   Temp 97.8 F (36.6 C) (Temporal)   Ht 5' 7.5" (1.715 m)   Wt 197 lb 2 oz (89.4 kg)   SpO2 96%   BMI 30.42 kg/m   Wt Readings from Last 3 Encounters:  12/02/21 197 lb 2 oz (89.4 kg)  05/10/21 202 lb (91.6 kg)  05/07/21 202 lb 9 oz (91.9 kg)      Physical Exam Vitals and nursing note reviewed.  Constitutional:      Appearance: Normal appearance.  HENT:     Head: Normocephalic and atraumatic.      Comments: Nodular flesh colored lesion to R posterior temporal region with faint telangectasia Skin:    General: Skin is warm and dry.     Findings: Lesion (see face exam) and rash present.          Comments: Thickened pruritic papular skin to anterior R thigh from recurrent scratching   Neurological:     Mental Status: He is alert.      Assessment &  Plan:   Problem List Items Addressed This Visit     Skin lesion of scalp - Primary    Skin lesion of uncertain cause, will need to r/o basal cell given bleeding and appearance - will refer to dermatology for further evaluation.       Relevant Orders   Ambulatory referral to Dermatology   Skin rash    R anterior thigh rash consistent with skin changes from recurrent scratching. He has fluticasone propionate 0.05% cream at home he will use to area. Discussed steroid precautions.         No orders of the defined types were placed in this encounter.  Orders Placed This Encounter  Procedures   Ambulatory referral to Dermatology    Referral Priority:   Routine    Referral Type:   Consultation    Referral Reason:   Specialty Services Required    Requested Specialty:   Dermatology    Number of Visits Requested:   1    Patient Instructions  We will refer you to skin doctor for spot on scalp.  For rash on right thigh - try steroid cream you have  at home (fluticasone). May use 1-2 times daily for 7-10 days, then regular moisturizing cream to area. Let us know if not improving with this.   Follow up plan: Return if symptoms worsen or fail to improve.  Ria Bush, MD

## 2021-12-02 NOTE — Assessment & Plan Note (Signed)
Skin lesion of uncertain cause, will need to r/o basal cell given bleeding and appearance - will refer to dermatology for further evaluation.

## 2021-12-02 NOTE — Patient Instructions (Addendum)
We will refer you to skin doctor for spot on scalp.  For rash on right thigh - try steroid cream you have at home (fluticasone). May use 1-2 times daily for 7-10 days, then regular moisturizing cream to area. Let us know if not improving with this.

## 2021-12-11 DIAGNOSIS — D1809 Hemangioma of other sites: Secondary | ICD-10-CM | POA: Diagnosis not present

## 2021-12-11 DIAGNOSIS — L308 Other specified dermatitis: Secondary | ICD-10-CM | POA: Diagnosis not present

## 2022-05-09 ENCOUNTER — Ambulatory Visit (INDEPENDENT_AMBULATORY_CARE_PROVIDER_SITE_OTHER): Payer: PPO | Admitting: Family Medicine

## 2022-05-09 ENCOUNTER — Encounter: Payer: Self-pay | Admitting: Family Medicine

## 2022-05-09 VITALS — BP 136/78 | HR 72 | Temp 97.5°F | Ht 67.0 in | Wt 198.0 lb

## 2022-05-09 DIAGNOSIS — Z0000 Encounter for general adult medical examination without abnormal findings: Secondary | ICD-10-CM

## 2022-05-09 DIAGNOSIS — Z8249 Family history of ischemic heart disease and other diseases of the circulatory system: Secondary | ICD-10-CM

## 2022-05-09 DIAGNOSIS — Z7189 Other specified counseling: Secondary | ICD-10-CM

## 2022-05-09 DIAGNOSIS — E785 Hyperlipidemia, unspecified: Secondary | ICD-10-CM

## 2022-05-09 DIAGNOSIS — I2510 Atherosclerotic heart disease of native coronary artery without angina pectoris: Secondary | ICD-10-CM

## 2022-05-09 DIAGNOSIS — I10 Essential (primary) hypertension: Secondary | ICD-10-CM

## 2022-05-09 DIAGNOSIS — E66811 Obesity, class 1: Secondary | ICD-10-CM

## 2022-05-09 DIAGNOSIS — Z125 Encounter for screening for malignant neoplasm of prostate: Secondary | ICD-10-CM

## 2022-05-09 DIAGNOSIS — Z23 Encounter for immunization: Secondary | ICD-10-CM

## 2022-05-09 DIAGNOSIS — E669 Obesity, unspecified: Secondary | ICD-10-CM

## 2022-05-09 DIAGNOSIS — M1A00X0 Idiopathic chronic gout, unspecified site, without tophus (tophi): Secondary | ICD-10-CM | POA: Diagnosis not present

## 2022-05-09 DIAGNOSIS — I77810 Thoracic aortic ectasia: Secondary | ICD-10-CM

## 2022-05-09 LAB — COMPREHENSIVE METABOLIC PANEL
ALT: 23 U/L (ref 0–53)
AST: 21 U/L (ref 0–37)
Albumin: 4.4 g/dL (ref 3.5–5.2)
Alkaline Phosphatase: 45 U/L (ref 39–117)
BUN: 10 mg/dL (ref 6–23)
CO2: 29 mEq/L (ref 19–32)
Calcium: 8.8 mg/dL (ref 8.4–10.5)
Chloride: 102 mEq/L (ref 96–112)
Creatinine, Ser: 0.87 mg/dL (ref 0.40–1.50)
GFR: 89.48 mL/min (ref >60.00)
Glucose, Bld: 94 mg/dL (ref 70–99)
Potassium: 4.1 mEq/L (ref 3.5–5.1)
Sodium: 140 mEq/L (ref 135–145)
Total Bilirubin: 0.6 mg/dL (ref 0.2–1.2)
Total Protein: 7 g/dL (ref 6.0–8.3)

## 2022-05-09 LAB — URIC ACID: Uric Acid, Serum: 6.4 mg/dL (ref 4.0–7.8)

## 2022-05-09 LAB — LIPID PANEL
Cholesterol: 180 mg/dL (ref 0–200)
HDL: 51.1 mg/dL (ref >39.00)
LDL Cholesterol: 109 mg/dL — ABNORMAL HIGH (ref 0–99)
NonHDL: 128.85
Total CHOL/HDL Ratio: 4
Triglycerides: 97 mg/dL (ref 0.0–149.0)
VLDL: 19.4 mg/dL (ref 0.0–40.0)

## 2022-05-09 LAB — PSA: PSA: 1.58 ng/mL (ref 0.10–4.00)

## 2022-05-09 NOTE — Assessment & Plan Note (Addendum)
Sees cardiology - due for f/u.

## 2022-05-09 NOTE — Assessment & Plan Note (Signed)
No recent gout flares - continue allopurinol '100mg'$  daily

## 2022-05-09 NOTE — Assessment & Plan Note (Signed)
Chronic, stable on current regimen - levels improve on recheck.

## 2022-05-09 NOTE — Assessment & Plan Note (Signed)

## 2022-05-09 NOTE — Assessment & Plan Note (Signed)
Preventative protocols reviewed and updated unless pt declined. Discussed healthy diet and lifestyle.  

## 2022-05-09 NOTE — Assessment & Plan Note (Signed)
Continue healthy diet and lifestyle choices.

## 2022-05-09 NOTE — Assessment & Plan Note (Addendum)
Normal vascular screen 08/2016

## 2022-05-09 NOTE — Assessment & Plan Note (Signed)
Advanced directives - discussed, no living will set up. Packet previously provided, again today. Oldest daughter would be HCPOA.

## 2022-05-09 NOTE — Progress Notes (Addendum)
Patient ID: Randy Decker, male    DOB: 06/25/1955, 67 y.o.   MRN: [id number]  This visit was conducted in person.  BP 136/78 (BP Location: Right Arm, Cuff Size: Large)   Pulse 72   Temp (!) 97.5 F (36.4 C) (Temporal)   Ht '5\' 7"'$  (1.702 m)   Wt 198 lb (89.8 kg)   SpO2 94%   BMI 31.01 kg/m   Checked with home cuff - 137/87, 149/100 L arm  CC: AMW/CPE Subjective:   HPI: Randy Decker is a 67 y.o. male presenting on 05/09/2022 for Medicare Wellness (Pt brought in home BP monitor to compare. Reading in office today-149/100.)   Did not see health advisor this year.   Hearing Screening   '500Hz'$  '1000Hz'$  '2000Hz'$  '4000Hz'$   Right ear 40 40 40 0  Left ear 40 0 25 0  Comments: States he had flu about 2 wks ago and still has some pressure/stuffiness in ears affecting hearing some.  Vision Screening - Comments:: Last eye exam, 04/2021. Scheduled for yrly exam.   [facility] Office Visit from 05/09/2022 in [facility] [place]  PHQ-2 Total Score 0          05/09/2022    9:45 AM 05/07/2021    8:53 AM 05/02/2020    9:18 AM  Fall Risk   Falls in the past year? 0 1 0  Comment  Tripped   Number falls in past yr:  0 0  Injury with Fall?  0 0  Follow up   Falls evaluation completed   S/p lap cholecystectomy 04/2021 for symptomatic gallstones ([place]).   Fmhx AAA (father) - pt had normal vascular screenings through [facility] 08/2016.  Fmhx CAD - father MI age 9. Coronary calcium score 80 (51%) (05/220).  Gout - on allopurinol '100mg'$  daily with significant improvement of flares.  ED - sildenafil '20mg'$  PRN.   Preventative: COLONOSCOPY Date: 07/2013 WNL Carlean Purl)  Prostate cancer screening - discussed, continue screening, no BPH sxs  Lung cancer screening - not eligible  Flu shot - yearly  [place] 06/2019 x2, 12/2019, 07/2020, bivalent booster 01/2021, 9/202 Tdap 2012  RSV 01/2022 Prevnar-13 2020, pneumovax today  Hep B series 2012 zostavax  2016 shingrix - 06/2018, 11/2018  Advanced directives - discussed, no living will set up. Packet previously provided, again today. Oldest daughter would be HCPOA.  Seat belt use discussed.  Sunscreen use discussed. No changing moles on skin.  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 Interim medical history since our last visit reviewed. Allergies and medications reviewed and updated. Outpatient Medications Prior to Visit  Medication Sig Dispense Refill   aspirin EC 81 MG tablet Take 1 tablet (81 mg total) by mouth daily. Swallow whole. 90 tablet 3   colchicine 0.6 MG tablet TAKE 1 TABLET BY MOUTH DAILY AS NEEDED (GOUT FLARE). 1ST DAY OF GOUT FLARE MAY TAKE 2 TABLETS @ ONCE 90 tablet 0   fluticasone (CUTIVATE) 0.05 %  cream Apply topically daily as needed.     oxyCODONE-acetaminophen (PERCOCET) 5-325 MG tablet Take 1 tablet by mouth every 4 (four) hours as needed for severe pain. 10 tablet 0   sildenafil (VIAGRA) 100 MG tablet TAKE 0.5 TO 1 TABLET BY MOUTH DAILY AS NEEDED FOR ERECTILE DYSFUNCTION (INS. COVERS 3 IN 31 DAYS). 5 tablet 4   allopurinol (ZYLOPRIM) 100 MG tablet Take 1 tablet (100 mg total) by mouth daily. 90 tablet 3   amLODipine (NORVASC) 10 MG tablet Take 1 tablet (10 mg total) by mouth daily. 90 tablet 3   lovastatin (MEVACOR) 40 MG tablet TAKE 1 TABLET BY MOUTH EVERYDAY AT BEDTIME 90 tablet 3   No facility-administered medications prior to visit.     Per HPI unless specifically indicated in  ROS section below Review of Systems  Constitutional:  Negative for activity change, appetite change, chills, fatigue, fever and unexpected weight change.  HENT:  Negative for hearing loss.   Eyes:  Negative for visual disturbance.  Respiratory:  Negative for cough, chest tightness, shortness of breath and wheezing.   Cardiovascular:  Negative for chest pain, palpitations and leg swelling.  Gastrointestinal:  Negative for abdominal distention, abdominal pain, blood in stool, constipation, diarrhea, nausea and vomiting.  Genitourinary:  Negative for difficulty urinating and hematuria.  Musculoskeletal:  Negative for arthralgias, myalgias and neck pain.  Skin:  Negative for rash.  Neurological:  Negative for dizziness, seizures, syncope and headaches.  Hematological:  Negative for adenopathy. Does not bruise/bleed easily.  Psychiatric/Behavioral:  Negative for dysphoric mood. The patient is not nervous/anxious.     Objective:  BP 136/78 (BP Location: Right Arm, Cuff Size: Large)   Pulse 72   Temp (!) 97.5 F (36.4 C) (Temporal)   Ht '5\' 7"'$  (1.702 m)   Wt 198 lb (89.8 kg)   SpO2 94%   BMI 31.01 kg/m   Wt Readings from Last 3 Encounters:  05/09/22 198 lb (89.8 kg)  12/02/21 197 lb 2 oz (89.4 kg)  05/10/21 202 lb (91.6 kg)      Physical Exam Vitals and nursing note reviewed.  Constitutional:      General: He is not in acute distress.    Appearance: Normal appearance. He is well-developed. He is not ill-appearing.  HENT:     Head: Normocephalic and atraumatic.     Right Ear: Hearing, tympanic membrane, ear canal and external ear normal.     Left Ear: Hearing, tympanic membrane, ear canal and external ear normal.     Mouth/Throat:     Comments: Wearing mask Eyes:     General: No scleral icterus.    Extraocular Movements: Extraocular movements intact.     Conjunctiva/sclera: Conjunctivae normal.     Pupils: Pupils are equal, round, and reactive to light.  Neck:     Thyroid: No  thyroid mass or thyromegaly.     Vascular: No carotid bruit.  Cardiovascular:     Rate and Rhythm: Normal rate and regular rhythm.     Pulses: Normal pulses.          Radial pulses are 2+ on the right side and 2+ on the left side.     Heart sounds: Normal heart sounds. No murmur heard. Pulmonary:     Effort: Pulmonary effort is normal. No respiratory distress.     Breath sounds: Normal breath sounds. No wheezing, rhonchi or rales.  Abdominal:     General: Bowel sounds are normal. There is no distension.  Palpations: Abdomen is soft. There is no mass.     Tenderness: There is no abdominal tenderness. There is no guarding or rebound.     Hernia: No hernia is present.  Musculoskeletal:        General: Normal range of motion.     Cervical back: Normal range of motion and neck supple.     Right lower leg: No edema.     Left lower leg: No edema.  Lymphadenopathy:     Cervical: No cervical adenopathy.  Skin:    General: Skin is warm and dry.     Findings: No rash.  Neurological:     General: No focal deficit present.     Mental Status: He is alert and oriented to person, place, and time.     Comments:  Recall 3/3 Calculation 5/5 DLROW  Psychiatric:        Mood and Affect: Mood normal.        Behavior: Behavior normal.        Thought Content: Thought content normal.        Judgment: Judgment normal.       Lab Results  Component Value Date   PSA 1.58 05/09/2022   PSA 1.72 05/07/2021   PSA 1.36 05/02/2020    Assessment & Plan:   Problem List Items Addressed This Visit     Health maintenance examination (Chronic)    Preventative protocols reviewed and updated unless pt declined. Discussed healthy diet and lifestyle.       Medicare annual wellness visit, subsequent - Primary (Chronic)    I have personally reviewed the Medicare Annual Wellness questionnaire and have noted 1. The patient's medical and social history 2. Their use of alcohol, tobacco or illicit  drugs 3. Their current medications and supplements 4. The patient's functional ability including ADL's, fall risks, home safety risks and hearing or visual impairment. Cognitive function has been assessed and addressed as indicated.  5. Diet and physical activity 6. Evidence for depression or mood disorders The patients weight, height, BMI have been recorded in the chart. I have made referrals, counseling and provided education to the patient based on review of the above and I have provided the pt with a written personalized care plan for preventive services. Provider list updated.. See scanned questionairre as needed for further documentation. Reviewed preventative protocols and updated unless pt declined.       Advanced directives, counseling/discussion (Chronic)    Advanced directives - discussed, no living will set up. Packet previously provided, again today. Oldest daughter would be HCPOA.       Family history of abdominal aortic aneurysm (AAA)    Normal vascular screen 08/2016      HLD (hyperlipidemia)    Update FLP on lovastatin. Check Lp(a) in fmhx premature CAD.  The 10-year ASCVD risk score (Arnett DK, et al., 2019) is: 19%   Values used to calculate the score:     Age: 10 years     Sex: Male     Is Non-Hispanic African American: No     Diabetic: No     Tobacco smoker: No     Systolic Blood Pressure: 654 mmHg     Is BP treated: Yes     HDL Cholesterol: 47.5 mg/dL     Total Cholesterol: 134 mg/dL       Relevant Medications   amLODipine (NORVASC) 10 MG tablet   lovastatin (MEVACOR) 40 MG tablet   Other Relevant Orders   Lipid panel (  Completed)   Comprehensive metabolic panel (Completed)   Lipoprotein A (LPA)   Essential hypertension    Chronic, stable on current regimen - levels improve on recheck.       Relevant Medications   amLODipine (NORVASC) 10 MG tablet   lovastatin (MEVACOR) 40 MG tablet   Obesity, Class I, BMI 30-34.9    Continue healthy diet and  lifestyle choices.       Gout    No recent gout flares - continue allopurinol '100mg'$  daily      Relevant Orders   Uric acid (Completed)   Dilated aortic root ([place])    Sees cardiology - due for f/u.       Relevant Medications   amLODipine (NORVASC) 10 MG tablet   lovastatin (MEVACOR) 40 MG tablet   Coronary artery calcification    Sees cardiology.       Relevant Medications   amLODipine (NORVASC) 10 MG tablet   lovastatin (MEVACOR) 40 MG tablet   Other Visit Diagnoses     Special screening for malignant neoplasm of prostate       Relevant Orders   PSA (Completed)   Family history of premature CAD       Relevant Orders   Lipoprotein A (LPA)   Need for 23-polyvalent pneumococcal polysaccharide vaccine       Relevant Orders   Pneumococcal polysaccharide vaccine 23-valent greater than or equal to 2yo subcutaneous/IM (Completed)        Meds ordered this encounter  Medications   allopurinol (ZYLOPRIM) 100 MG tablet    Sig: Take 1 tablet (100 mg total) by mouth daily.    Dispense:  90 tablet    Refill:  4   amLODipine (NORVASC) 10 MG tablet    Sig: Take 1 tablet (10 mg total) by mouth daily.    Dispense:  90 tablet    Refill:  4   lovastatin (MEVACOR) 40 MG tablet    Sig: TAKE 1 TABLET BY MOUTH EVERYDAY AT BEDTIME    Dispense:  90 tablet    Refill:  4    Orders Placed This Encounter  Procedures   Pneumococcal polysaccharide vaccine 23-valent greater than or equal to 2yo subcutaneous/IM   Lipid panel   Comprehensive metabolic panel   Uric acid   Lipoprotein A (LPA)   PSA    Patient Instructions  Pnemovax-23 today.  Labs today.  Schedule follow up with heart doctor. [phone]  Return as needed or in 1 year for next wellness visit   Follow up plan: Return in about 1 year (around 05/10/2023) for annual exam, prior fasting for blood work, medicare wellness visit.  Ria Bush, MD

## 2022-05-09 NOTE — Assessment & Plan Note (Signed)
Sees cardiology.

## 2022-05-09 NOTE — Assessment & Plan Note (Signed)
Update FLP on lovastatin. Check Lp(a) in fmhx premature CAD.  The 10-year ASCVD risk score (Arnett DK, et al., 2019) is: 19%   Values used to calculate the score:     Age: 67 years     Sex: Male     Is Non-Hispanic African American: No     Diabetic: No     Tobacco smoker: No     Systolic Blood Pressure: 579 mmHg     Is BP treated: Yes     HDL Cholesterol: 47.5 mg/dL     Total Cholesterol: 134 mg/dL

## 2022-05-09 NOTE — Patient Instructions (Addendum)
Pnemovax-23 today.  Labs today.  Schedule follow up with heart doctor. [phone]  Return as needed or in 1 year for next wellness visit

## 2022-05-10 MED ORDER — AMLODIPINE BESYLATE 10 MG PO TABS
10.0000 mg | ORAL_TABLET | Freq: Every day | ORAL | 4 refills | Status: DC
Start: 2022-05-10 — End: 2023-05-12

## 2022-05-10 MED ORDER — ALLOPURINOL 100 MG PO TABS: 100.0000 mg | ORAL_TABLET | Freq: Every day | ORAL | 4 refills | Status: DC

## 2022-05-10 MED ORDER — LOVASTATIN 40 MG PO TABS: ORAL_TABLET | ORAL | 4 refills | Status: DC

## 2022-05-10 NOTE — Addendum Note (Signed)
Addended by: Ria Bush on: 05/10/2022 11:10 AM   Modules accepted: Orders

## 2022-05-14 ENCOUNTER — Encounter: Payer: Self-pay | Admitting: Internal Medicine

## 2022-05-14 ENCOUNTER — Ambulatory Visit: Payer: PPO | Attending: Internal Medicine | Admitting: Internal Medicine

## 2022-05-14 VITALS — BP 124/80 | HR 65 | Ht 67.0 in | Wt 205.8 lb

## 2022-05-14 DIAGNOSIS — E785 Hyperlipidemia, unspecified: Secondary | ICD-10-CM

## 2022-05-14 DIAGNOSIS — Z8249 Family history of ischemic heart disease and other diseases of the circulatory system: Secondary | ICD-10-CM

## 2022-05-14 DIAGNOSIS — I10 Essential (primary) hypertension: Secondary | ICD-10-CM

## 2022-05-14 DIAGNOSIS — I2584 Coronary atherosclerosis due to calcified coronary lesion: Secondary | ICD-10-CM

## 2022-05-14 DIAGNOSIS — I77810 Thoracic aortic ectasia: Secondary | ICD-10-CM | POA: Diagnosis not present

## 2022-05-14 DIAGNOSIS — I2510 Atherosclerotic heart disease of native coronary artery without angina pectoris: Secondary | ICD-10-CM

## 2022-05-14 LAB — LIPOPROTEIN A (LPA): Lipoprotein (a): 55 nmol/L (ref <75)

## 2022-05-14 NOTE — Progress Notes (Addendum)
Cardiology Office Note:    Date:  05/14/2022   ID:  Randy Decker, DOB Jun 25, 1955, MRN [id number]  PCP:  Ria Bush, MD  Cardiologist:  Elouise Munroe, MD  Electrophysiologist:  None   Referring MD: Ria Bush, MD   Chief Complaint/Reason for Referral: Follow up abnormal ECG  History of Present Illness:    Randy Decker is a 67 y.o. male with a history of HTN, HLD who presents for abnormal EKG at the request of Dr. Danise Mina in family medicine.  Today, he presents feeling overall well.  He reports intermittent pain and soreness in his left distal LE . He has not noticed any patterns or triggers to the pain. He pain occurs a few times a day intermittently during the day and night. Denies claudication.  Cal score 80. He has been monitoring his cholesterol and managing it with lovastatin 40 mg daily. We discussed his latest labs. His LDL was 109, last year it was 68. He is unsure of why it has increased in the last year and could not identify any notable changes.  We also discussed his EKG, with no new findings or concerns.   He has been eating healthier lately and reports having lost weight. He tends to avoid eating fried food, meat, cheese, and sweets like ice cream. He enjoys eating peanut butter think this may be contributing to elevated LDL.  He stays very active by walking for a couple hours every day, though due to the cold weather he has not been able to walk every day this week. He denies shortness of breath while walking. He used to run regularly when he was younger and notices he has lost a bit of endurance.   He denies any palpitations, chest pain, shortness of breath, or peripheral edema. No lightheadedness, headaches, syncope, orthopnea, or PND.  Past Medical History:  Diagnosis Date   Essential hypertension 02/27/2015   H/O measles    possible--can't remember   History of chicken pox    HLD (hyperlipidemia)    Obesity, Class I, BMI 30-34.9  02/27/2015    Past Surgical History:  Procedure Laterality Date   CHOLECYSTECTOMY N/A 05/10/2021   Procedure: LAPAROSCOPIC CHOLECYSTECTOMY;  Surgeon: Felicie Morn, MD;  Location: [place];  Service: General;  Laterality: N/A;   COLONOSCOPY  2015   WNL ([place])   TONSILLECTOMY AND ADENOIDECTOMY  [id number]   UMBILICAL HERNIA REPAIR  2013    Current Medications: Current Meds  Medication Sig   allopurinol (ZYLOPRIM) 100 MG tablet Take 1 tablet (100 mg total) by mouth daily.   amLODipine (NORVASC) 10 MG tablet Take 1 tablet (10 mg total) by mouth daily.   aspirin EC 81 MG tablet Take 1 tablet (81 mg total) by mouth daily. Swallow whole.   colchicine 0.6 MG tablet TAKE 1 TABLET BY MOUTH DAILY AS NEEDED (GOUT FLARE). 1ST DAY OF GOUT FLARE MAY TAKE 2 TABLETS @ ONCE   fluticasone (CUTIVATE) 0.05 % cream Apply topically daily as needed.   lovastatin (MEVACOR) 40 MG tablet TAKE 1 TABLET BY MOUTH EVERYDAY AT BEDTIME   sildenafil (VIAGRA) 100 MG tablet TAKE 0.5 TO 1 TABLET BY MOUTH DAILY AS NEEDED FOR ERECTILE DYSFUNCTION (INS. COVERS 3 IN 31 DAYS).     Allergies:   Patient has no known allergies.   Social History   Tobacco Use   Smoking status: Former    Packs/day: 1.00    Years: 5.00    Total pack years:  5.00    Types: Cigarettes    Start date: 04/21/1978    Quit date: 04/22/1983    Years since quitting: 39.1   Smokeless tobacco: Never  Substance Use Topics   Alcohol use: Yes    Comment: Occasional   Drug use: No     Family History: The patient's family history includes Aortic aneurysm (age of onset: 48) in his father; Hypertension in his father. There is no history of Diabetes, Cancer, Stroke, or Colon cancer.  ROS:   Please see the history of present illness.    (+) Intermittent pain (left distal LE) (+) Intermittent soreness (left distal LE) (+) weight loss All other systems reviewed and are negative.  EKGs/Labs/Other Studies Reviewed:    The  following studies were reviewed today:  Echo 06/05/2021: IMPRESSIONS   1. Left ventricular ejection fraction, by estimation, is 55 to 60%. The  left ventricle has normal function. The left ventricle has no regional  wall motion abnormalities. Left ventricular diastolic parameters were  normal.   2. Right ventricular systolic function is normal. The right ventricular  size is normal.   3. The mitral valve is normal in structure. Mild mitral valve  regurgitation.   4. The aortic valve is tricuspid. Aortic valve regurgitation is not  visualized.   5. There is borderline dilatation of the ascending aorta, measuring 38  mm.   6. The inferior vena cava is normal in size with greater than 50%  respiratory variability, suggesting right atrial pressure of 3 mmHg.    Echo 06/04/2020:  1. Left ventricular ejection fraction, by estimation, is 60 to 65%. The  left ventricle has normal function. The left ventricle has no regional  wall motion abnormalities. Left ventricular diastolic parameters were  normal.   2. Right ventricular systolic function is normal. The right ventricular  size is normal.   3. The mitral valve is normal in structure. Trivial mitral valve  regurgitation. No evidence of mitral stenosis.   4. The aortic valve is tricuspid. Aortic valve regurgitation is not  visualized. No aortic stenosis is present.   5. Aortic dilatation noted. There is mild dilatation of the aortic root,  measuring 40 mm.   6. The inferior vena cava is normal in size with greater than 50%  respiratory variability, suggesting right atrial pressure of 3 mmHg.  CT Cardiac Scoring 06/04/2020: IMPRESSION: Coronary calcium score of 80. This was 51st percentile for age and sex matched control. Coronary calcifications are seen in the left main coronary artery and proximal RCA.  EKG: EKG is personally reviewed. 05/14/2022: Sinus rhythm. Rate 65 bpm. 10/05/2020: NSR, rate 63 bpm  06/26/2020: EKG was not  ordered.  I have independently reviewed the images from CT cor cal study 06/04/20.  Recent Labs: 05/09/2022: ALT 23; BUN 10; Creatinine, Ser 0.87; Potassium 4.1; Sodium 140  Recent Lipid Panel    Component Value Date/Time   CHOL 180 05/09/2022 0936   TRIG 97.0 05/09/2022 0936   HDL 51.10 05/09/2022 0936   CHOLHDL 4 05/09/2022 0936   VLDL 19.4 05/09/2022 0936   LDLCALC 109 (H) 05/09/2022 0936   LDLDIRECT 150.6 03/31/2013 1122    Physical Exam:    VS:  BP 124/80   Pulse 65   Ht 5' 7"$  (1.702 m)   Wt 205 lb 12.8 oz (93.4 kg)   SpO2 95%   BMI 32.23 kg/m     Wt Readings from Last 5 Encounters:  05/14/22 205 lb 12.8 oz (93.4 kg)  05/09/22 198 lb (89.8 kg)  12/02/21 197 lb 2 oz (89.4 kg)  05/10/21 202 lb (91.6 kg)  05/07/21 202 lb 9 oz (91.9 kg)    Constitutional: No acute distress Eyes: sclera non-icteric, normal conjunctiva and lids ENMT: normal dentition, moist mucous membranes Cardiovascular:  regular rhythm, normal rate, no murmurs. S1 and S2 normal. No jugular venous distention.  Respiratory: clear to auscultation bilaterally GI : normal bowel sounds, soft and nontender. No distention.   MSK: extremities warm, well perfused. No edema.  NEURO: grossly nonfocal exam, moves all extremities. PSYCH: alert and oriented x 3, normal mood and affect.   ASSESSMENT:    1. Coronary artery calcification   2. Hyperlipidemia, unspecified hyperlipidemia type   3. Dilated aortic root ([place])   4. Primary hypertension   5. Family history of abdominal aortic aneurysm (AAA)     PLAN:    Coronary artery calcifications - CAC score of 80 which is 51st percentile. Would resume aspirin 81 mg daily for secondary prevention of CAD.   Abnormal EKG - EKG normal today. Abnl ECG may have been related to lead placement.  - no RWMA on echo. No current concern for prior infarct or structural heart disease.   Hyperlipidemia, unspecified hyperlipidemia type - ASCVD risk score is 15.5%,  intermediate risk. Cor cal score 80 which is 51st percentile. Would like to intensify statin therapy. He would like to try diet modification first given previously optimal LDL. Will recheck in 3 mo.   Essential hypertension - BP normal, continue amlodipine 10 mg daily.  - borderline, likely normal range, aortic sinus dilation on echo, will repeat an echo in 1 year to review.    Family history of abdominal aortic aneurysm (AAA) - he has been screened in 2018 and no AAA found. He is a former, very remote smoker. Age appropriate screening complete.   Total time of encounter: 30 minutes total time of encounter, including 20 minutes spent in face-to-face patient care on the date of this encounter. This time includes coordination of care and counseling regarding above mentioned problem list. Remainder of non-face-to-face time involved reviewing chart documents/testing relevant to the patient encounter and documentation in the medical record. I have independently reviewed documentation from referring provider.   Cherlynn Kaiser, MD, [facility]    Medication Adjustments/Labs and Tests Ordered: Current medicines are reviewed at length with the patient today.  Concerns regarding medicines are outlined above.   Orders Placed This Encounter  Procedures   Lipid panel   EKG 12-Lead   No orders of the defined types were placed in this encounter.  Patient Instructions  Medication Instructions:  No changes *If you need a refill on your cardiac medications before your next appointment, please call your pharmacy*   Lab Work: Lab work in 3 months: Fasting Lipid panel and LPa If you have labs (blood work) drawn today and your tests are completely normal, you will receive your results only by: [place] (if you have MyChart) OR A paper copy in the mail If you have any lab test that is abnormal or we need to change your treatment, we will call you to review the  results.  Follow-Up: At [facility], you and your health needs are our priority.  As part of our continuing mission to provide you with exceptional heart care, we have created designated Provider Care Teams.  These Care Teams include your primary Cardiologist (physician) and Advanced Practice Providers (APPs -  Physician Assistants and Nurse Practitioners) who all work together to provide you with the care you need, when you need it.  We recommend signing up for the patient portal called "MyChart".  Sign up information is provided on this After Visit Summary.  MyChart is used to connect with patients for Virtual Visits (Telemedicine).  Patients are able to view lab/test results, encounter notes, upcoming appointments, etc.  Non-urgent messages can be sent to your provider as well.   To learn more about what you can do with MyChart, go to [email or web address].    Your next appointment:   12 year(s)  Provider:   Elouise Munroe, MD        I,Rachel Rivera,acting as a scribe for Elouise Munroe, MD.,have documented all relevant documentation on the behalf of Elouise Munroe, MD,as directed by  Elouise Munroe, MD while in the presence of Elouise Munroe, MD.  I, Elouise Munroe, MD, have reviewed all documentation for the visit on 05/14/2022. The documentation on today's date of service for the exam, diagnosis, procedures, and orders are all accurate and complete.

## 2022-05-14 NOTE — Patient Instructions (Signed)
Medication Instructions:  No changes *If you need a refill on your cardiac medications before your next appointment, please call your pharmacy*   Lab Work: Lab work in 3 months: Fasting Lipid panel and LPa If you have labs (blood work) drawn today and your tests are completely normal, you will receive your results only by: [place] (if you have MyChart) OR A paper copy in the mail If you have any lab test that is abnormal or we need to change your treatment, we will call you to review the results.  Follow-Up: At [facility], you and your health needs are our priority.  As part of our continuing mission to provide you with exceptional heart care, we have created designated Provider Care Teams.  These Care Teams include your primary Cardiologist (physician) and Advanced Practice Providers (APPs -  Physician Assistants and Nurse Practitioners) who all work together to provide you with the care you need, when you need it.  We recommend signing up for the patient portal called "MyChart".  Sign up information is provided on this After Visit Summary.  MyChart is used to connect with patients for Virtual Visits (Telemedicine).  Patients are able to view lab/test results, encounter notes, upcoming appointments, etc.  Non-urgent messages can be sent to your provider as well.   To learn more about what you can do with MyChart, go to [email or web address].    Your next appointment:   12 year(s)  Provider:   Elouise Munroe, MD

## 2022-05-15 ENCOUNTER — Encounter: Payer: Self-pay | Admitting: Family Medicine

## 2022-05-15 DIAGNOSIS — Z8249 Family history of ischemic heart disease and other diseases of the circulatory system: Secondary | ICD-10-CM | POA: Insufficient documentation

## 2022-07-28 ENCOUNTER — Other Ambulatory Visit: Payer: Self-pay | Admitting: Family Medicine

## 2022-07-28 NOTE — Telephone Encounter (Signed)
Refill request Sildenafil Last refill 12/03/21 #5/4 refills Last office visit 05/06/22

## 2022-09-04 ENCOUNTER — Other Ambulatory Visit: Payer: Self-pay

## 2022-09-04 DIAGNOSIS — I2510 Atherosclerotic heart disease of native coronary artery without angina pectoris: Secondary | ICD-10-CM

## 2022-09-04 DIAGNOSIS — E785 Hyperlipidemia, unspecified: Secondary | ICD-10-CM

## 2023-01-29 IMAGING — CT CT CARDIAC CORONARY ARTERY CALCIUM SCORE
3 series · 14 of 20 positions shown, 15 images · non-contrast
Comparison: None.
COMPARISON: None.

Addendum:
EXAM:
OVER-READ INTERPRETATION  CT CHEST

The following report is an over-read performed by radiologist Dr.
Chanice Rasmussen [REDACTED] on 06/04/2020. This over-read
does not include interpretation of cardiac or coronary anatomy or
pathology. The calcium score interpretation by the cardiologist is
attached.
CLINICAL DATA: Risk stratification, CAD screening, 10yr CHD risk
10-20%, treadmill candidate
Coronary Calcium Score
TECHNIQUE: The patient was scanned on a Siemens Force scanner. Axial
non-contrast 3 mm slices were carried out through the heart. The
data set was analyzed on a dedicated work station and scored using
the Agatston method.

[Series 2: casc 3.0 bv41 2 bestdiast 68 % · axial · 0.54mm/px · z∈[-258,-186]mm · 4 of 41 slices shown, 5 images]
[im 9/41  vessel]
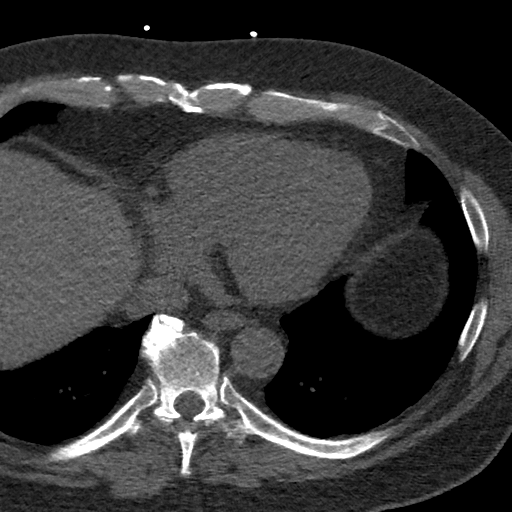
[im 9/41  lung]
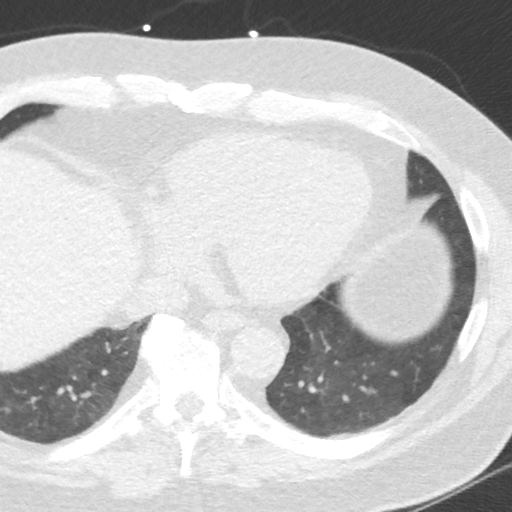
[im 17/41  vessel]
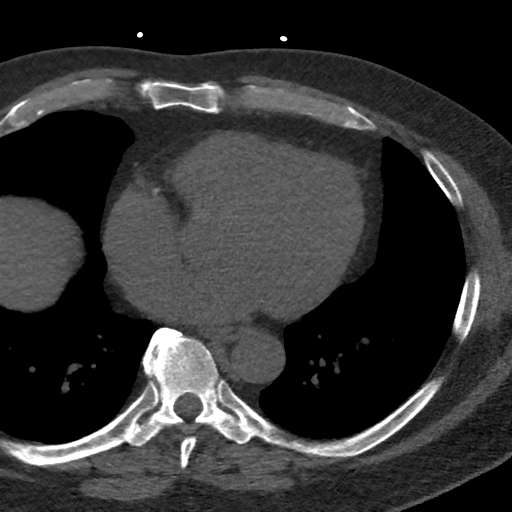
[im 25/41  vessel]
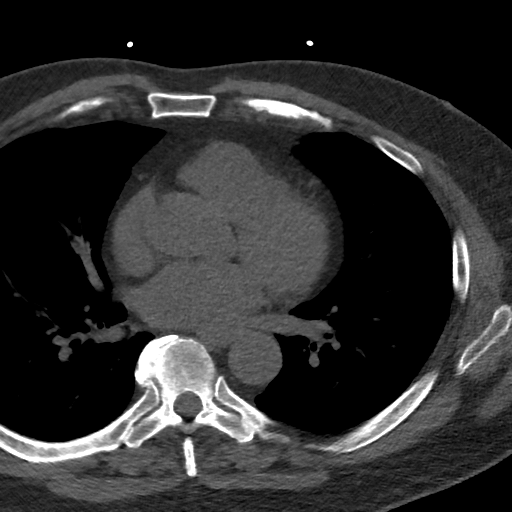
[im 33/41  vessel]
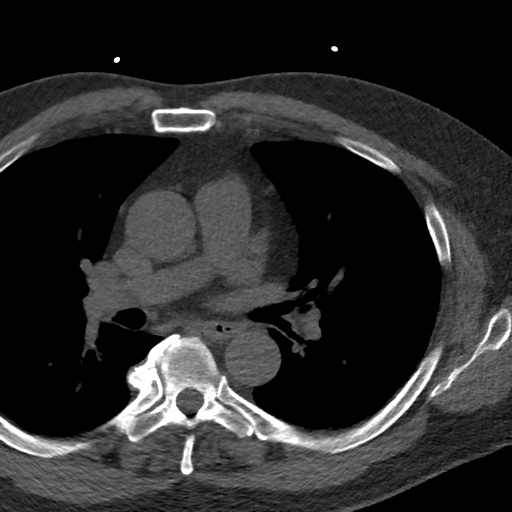

[Series 3: lung 68 % · axial · 0.78mm/px · z∈[-264,-184]mm · 5 of 41 slices shown]
[im 7/41  lung]
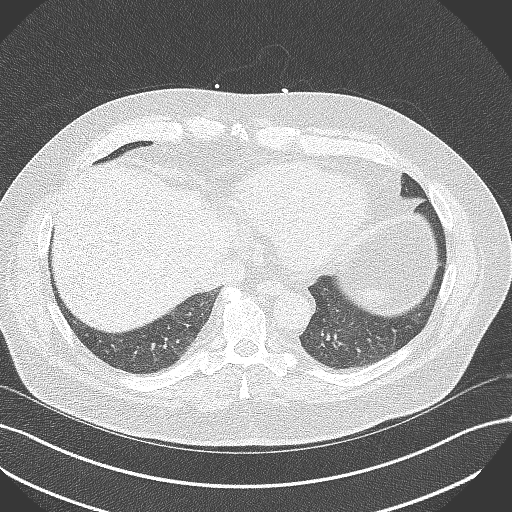
[im 14/41  lung]
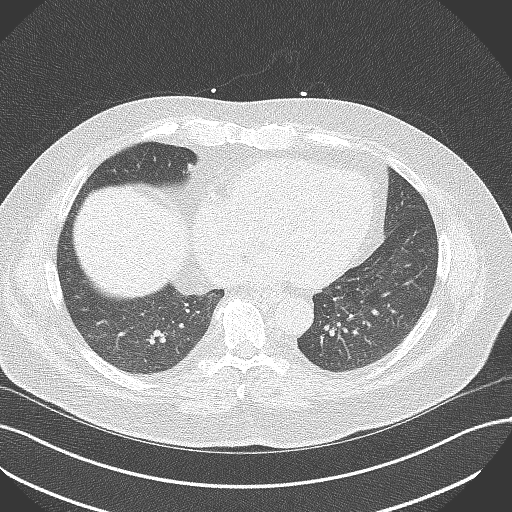
[im 21/41  lung]
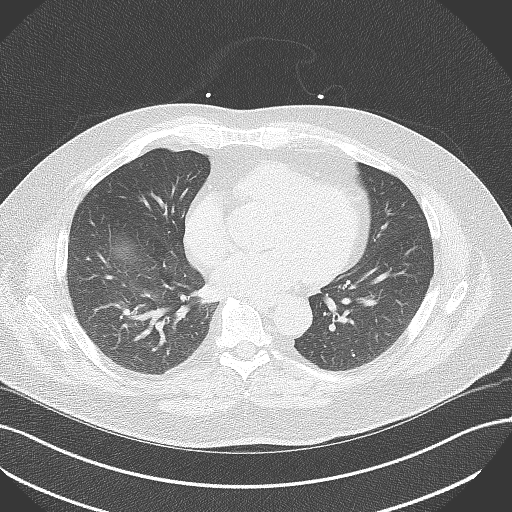
[im 27/41  lung]
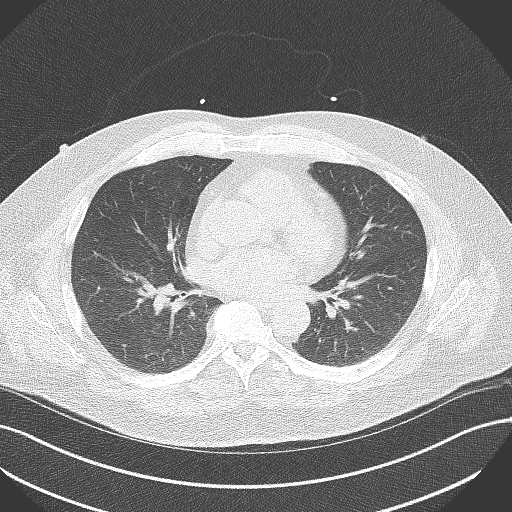
[im 34/41  lung]
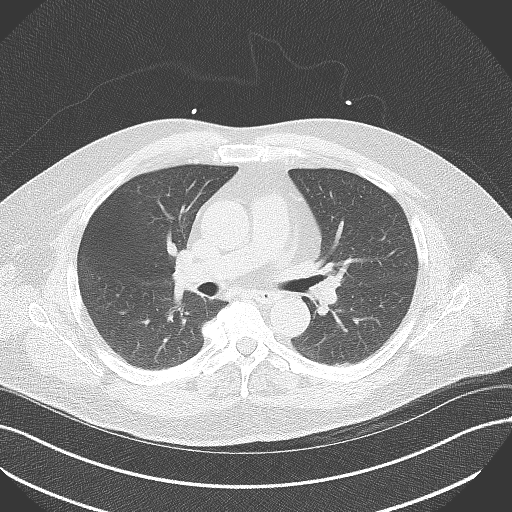

[Series 4: lung st 68 % · axial · 0.78mm/px · z∈[-264,-184]mm · 5 of 41 slices shown]
[im 7/41  lung]
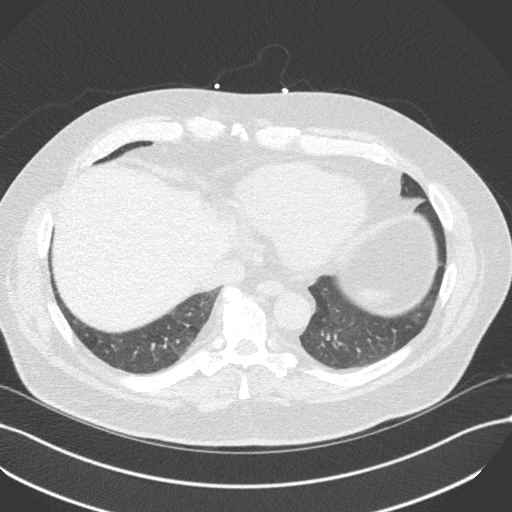
[im 14/41  lung]
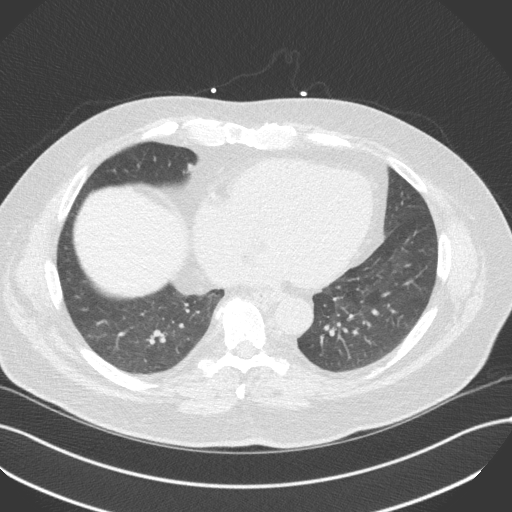
[im 21/41  lung]
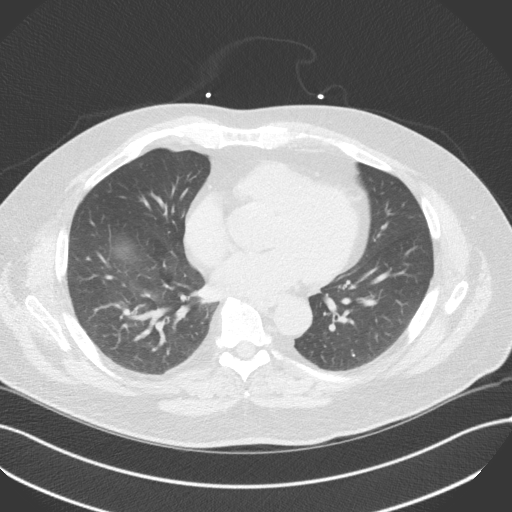
[im 27/41  lung]
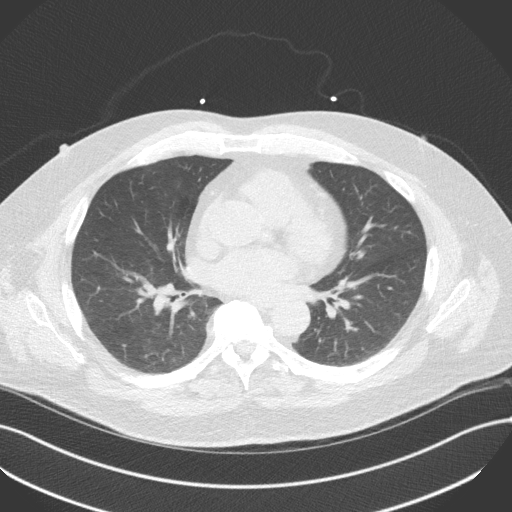
[im 34/41  lung]
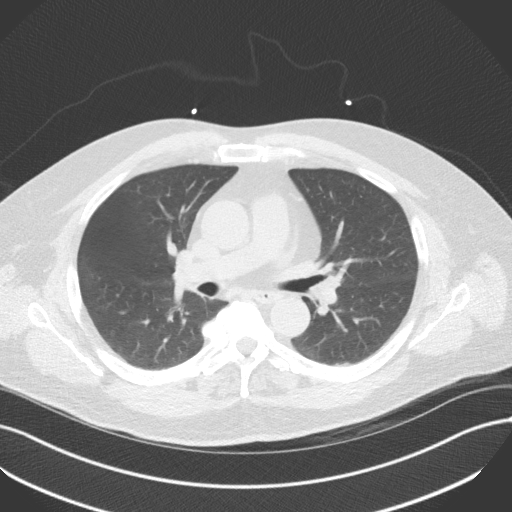

[14 of 20 positions shown; findings below may reference images not displayed]

FINDINGS: Vascular: Normal aortic caliber.

Mediastinum/Nodes: No imaged thoracic adenopathy.

Lungs/Pleura: No pleural fluid.  Clear imaged lungs.

Upper Abdomen: Normal imaged portions of the liver, spleen, stomach.

Musculoskeletal: No acute osseous abnormality.
IMPRESSION: No acute findings in the imaged extracardiac chest.
FINDINGS: Non-cardiac: See separate report from [REDACTED].

Ascending Aorta: 37 mm at the mid ascending aorta measured in an
axial plane.

Pericardium: Normal

Coronary arteries:

Coronary calcium score of 80. This was 51st percentile for age and
sex matched control. Coronary calcifications are seen in the left
main coronary artery and proximal RCA.
IMPRESSION: Coronary calcium score of 80. This was 51st percentile for age and
sex matched control. Coronary calcifications are seen in the left
main coronary artery and proximal RCA.

*** End of Addendum ***
EXAM:
OVER-READ INTERPRETATION  CT CHEST

The following report is an over-read performed by radiologist Dr.
Chanice Rasmussen [REDACTED] on 06/04/2020. This over-read
does not include interpretation of cardiac or coronary anatomy or
pathology. The calcium score interpretation by the cardiologist is
attached.
FINDINGS: Vascular: Normal aortic caliber.

Mediastinum/Nodes: No imaged thoracic adenopathy.

Lungs/Pleura: No pleural fluid.  Clear imaged lungs.

Upper Abdomen: Normal imaged portions of the liver, spleen, stomach.

Musculoskeletal: No acute osseous abnormality.
IMPRESSION: No acute findings in the imaged extracardiac chest.

## 2023-04-30 ENCOUNTER — Ambulatory Visit (INDEPENDENT_AMBULATORY_CARE_PROVIDER_SITE_OTHER): Payer: PPO

## 2023-04-30 VITALS — Ht 67.0 in | Wt 205.0 lb

## 2023-04-30 DIAGNOSIS — Z0000 Encounter for general adult medical examination without abnormal findings: Secondary | ICD-10-CM

## 2023-04-30 DIAGNOSIS — Z1211 Encounter for screening for malignant neoplasm of colon: Secondary | ICD-10-CM

## 2023-04-30 NOTE — Patient Instructions (Signed)
 Mr. Mcphearson , Thank you for taking time to come for your Medicare Wellness Visit. I appreciate your ongoing commitment to your health goals. Please review the following plan we discussed and let me know if I can assist you in the future.   Referrals/Orders/Follow-Ups/Clinician Recommendations:   A referral for colonoscopy has been placed for you. Please call to schedule if you have not heard from them in 7 days. Please schedule on or after 08/11/2023.  [facility] [address] [place],  [place]  [address] Main: [phone]  Dr Avram  This is a list of the screening recommended for you and due dates:  Health Maintenance  Topic Date Due   DTaP/Tdap/Td vaccine (2 - Td or Tdap) 04/21/2020   COVID-19 Vaccine (9 - 2024-25 season) 03/13/2023   Colon Cancer Screening  08/10/2023   Medicare Annual Wellness Visit  04/29/2024   Pneumonia Vaccine  Completed   Flu Shot  Completed   Hepatitis C Screening  Completed   Zoster (Shingles) Vaccine  Completed   HPV Vaccine  Aged Out    Advanced directives: (Declined) Advance directive discussed with you today. Even though you declined this today, please call our office should you change your mind, and we can give you the proper paperwork for you to fill out.  Next Medicare Annual Wellness Visit scheduled for next year: Yes 05/03/2023 @ 1:40pm televisit

## 2023-04-30 NOTE — Progress Notes (Signed)
 Subjective:   Randy Decker is a 68 y.o. male who presents for Medicare Annual/Subsequent preventive examination.  Visit Complete: Virtual I connected with  Randy Decker on 04/30/23 by a audio enabled telemedicine application and verified that I am speaking with the correct person using two identifiers.  Patient Location: Home  Provider Location: Office/Clinic  I discussed the limitations of evaluation and management by telemedicine. The patient expressed understanding and agreed to proceed.  Vital Signs: Because this visit was a virtual/telehealth visit, some criteria may be missing or patient reported. Any vitals not documented were not able to be obtained and vitals that have been documented are patient reported.  Patient Medicare AWV questionnaire was completed by the patient on 04/29/23; I have confirmed that all information answered by patient is correct and no changes since this date.  Cardiac Risk Factors include: advanced age (>33men, >65 women);dyslipidemia;hypertension;male gender;obesity (BMI >30kg/m2)    Objective:    Today's Vitals   04/30/23 1305  Weight: 205 lb (93 kg)  Height: 5' 7 (1.702 m)   Body mass index is 32.11 kg/m.     04/30/2023    1:16 PM 05/10/2021    6:03 AM  Advanced Directives  Does Patient Have a Medical Advance Directive? No No  Would patient like information on creating a medical advance directive?  No - Patient declined    Current Medications (verified) Outpatient Encounter Medications as of 04/30/2023  Medication Sig   allopurinol  (ZYLOPRIM ) 100 MG tablet Take 1 tablet (100 mg total) by mouth daily.   amLODipine  (NORVASC ) 10 MG tablet Take 1 tablet (10 mg total) by mouth daily.   aspirin  EC 81 MG tablet Take 1 tablet (81 mg total) by mouth daily. Swallow whole.   colchicine  0.6 MG tablet TAKE 1 TABLET BY MOUTH DAILY AS NEEDED (GOUT FLARE). 1ST DAY OF GOUT FLARE MAY TAKE 2 TABLETS @ ONCE   fluticasone (CUTIVATE) 0.05 % cream Apply  topically daily as needed.   lovastatin  (MEVACOR ) 40 MG tablet TAKE 1 TABLET BY MOUTH EVERYDAY AT BEDTIME   sildenafil  (VIAGRA ) 100 MG tablet TAKE 0.5 TO 1 TABLET BY MOUTH DAILY AS NEEDED FOR ERECTILE DYSFUNCTION (INS. COVERS 3 IN 31 DAYS).   No facility-administered encounter medications on file as of 04/30/2023.    Allergies (verified) Patient has no known allergies.   History: Past Medical History:  Diagnosis Date   Essential hypertension 02/27/2015   H/O measles    possible--can't remember   History of chicken pox    HLD (hyperlipidemia)    Obesity, Class I, BMI 30-34.9 02/27/2015   Past Surgical History:  Procedure Laterality Date   CHOLECYSTECTOMY N/A 05/10/2021   Procedure: LAPAROSCOPIC CHOLECYSTECTOMY;  Surgeon: Stechschulte, Deward PARAS, MD;  Location: [facility];  Service: General;  Laterality: N/A;   COLONOSCOPY  2015   WNL ([facility])   TONSILLECTOMY AND ADENOIDECTOMY  1964   UMBILICAL HERNIA REPAIR  2013   Family History  Problem Relation Age of Onset   Hypertension Father    Aortic aneurysm Father 62       deceased   Diabetes Neg Hx    Cancer Neg Hx    Stroke Neg Hx    Colon cancer Neg Hx    Social History   Socioeconomic History   Marital status: Single    Spouse name: Not on file   Number of children: Not on file   Years of education: Not on file   Highest education level:  Not on file  Occupational History   Not on file  Tobacco Use   Smoking status: Former    Current packs/day: 0.00    Average packs/day: 1 pack/day for 5.0 years (5.0 ttl pk-yrs)    Types: Cigarettes    Start date: 04/21/1978    Quit date: 04/22/1983    Years since quitting: 40.0   Smokeless tobacco: Never  Substance and Sexual Activity   Alcohol use: Yes    Comment: Occasional   Drug use: No   Sexual activity: Not on file  Other Topics Concern   Not on file  Social History Narrative   Divorced.    Lives alone, 2 cats and 1 dog   Occupation: maintenance Midwife)   Edu: Automotive Engineer   Activity: active at work   Diet: some water, fruits/vegetables daily, rare meat   Social Drivers of Corporate Investment Banker Strain: Low Risk  (04/30/2023)   Overall Financial Resource Strain (CARDIA)    Difficulty of Paying Living Expenses: Not hard at all  Food Insecurity: No Food Insecurity (04/30/2023)   Hunger Vital Sign    Worried About Running Out of Food in the Last Year: Never true    Ran Out of Food in the Last Year: Never true  Transportation Needs: No Transportation Needs (04/30/2023)   PRAPARE - Administrator, Civil Service (Medical): No    Lack of Transportation (Non-Medical): No  Physical Activity: Insufficiently Active (04/30/2023)   Exercise Vital Sign    Days of Exercise per Week: 4 days    Minutes of Exercise per Session: 30 min  Stress: No Stress Concern Present (04/30/2023)   [employer] of Occupational Health - Occupational Stress Questionnaire    Feeling of Stress : Not at all  Social Connections: Unknown (04/30/2023)   Social Connection and Isolation Panel [NHANES]    Frequency of Communication with Friends and Family: Three times a week    Frequency of Social Gatherings with Friends and Family: Once a week    Attends Religious Services: Never    Database Administrator or Organizations: No    Attends Engineer, Structural: Never    Marital Status: Not on file    Tobacco Counseling Counseling given: Not Answered  Clinical Intake:  Pre-visit preparation completed: Yes  Pain : No/denies pain    BMI - recorded: 32.11 Nutritional Status: BMI > 30  Obese Nutritional Risks: None Diabetes: No  How often do you need to have someone help you when you read instructions, pamphlets, or other written materials from your doctor or pharmacy?: 1 - Never  Interpreter Needed?: No  Comments: lives alone Information entered by :: B.Teran Daughenbaugh,LPN   Activities of Daily Living    04/29/2023    3:00 PM  In your  present state of health, do you have any difficulty performing the following activities:  Hearing? 0  Vision? 0  Difficulty concentrating or making decisions? 0  Walking or climbing stairs? 0  Dressing or bathing? 0  Doing errands, shopping? 0  Preparing Food and eating ? N  Using the Toilet? N  In the past six months, have you accidently leaked urine? N  Do you have problems with loss of bowel control? N  Managing your Medications? N  Managing your Finances? N  Housekeeping or managing your Housekeeping? N    Patient Care Team: Rilla Baller, MD as PCP - General (Family Medicine) Loni Soyla LABOR, MD as PCP - Cardiology (  Cardiology)  Indicate any recent Medical Services you may have received from other than [facility] providers in the past year (date may be approximate).     Assessment:   This is a routine wellness examination for Randy Decker.  Hearing/Vision screen Hearing Screening - Comments:: Pt says his hearing is not as good but doing alright Vision Screening - Comments:: Pt says his vision is little less but doing alright Dr Joshua Montie for eye exam   Goals Addressed               This Visit's Progress     Patient Stated (pt-stated)        My goal is to get to 170lb, getting back into walking and continuing elliptical.       Depression Screen    04/30/2023    1:12 PM 05/09/2022    9:45 AM 05/07/2021    8:54 AM 05/02/2020    9:18 AM 10/05/2019   10:33 AM 09/22/2017    9:32 AM  PHQ 2/9 Scores  PHQ - 2 Score 0 0 0 0 0 0  PHQ- 9 Score  1        Fall Risk    04/29/2023    3:00 PM 05/09/2022    9:45 AM 05/07/2021    8:53 AM 05/02/2020    9:18 AM  Fall Risk   Falls in the past year? 0 0 1 0  Comment   Tripped   Number falls in past yr: 0  0 0  Injury with Fall? 0  0 0  Risk for fall due to : No Fall Risks     Follow up Education provided;Falls prevention discussed   Falls evaluation completed    MEDICARE RISK AT HOME: Medicare Risk at Home Any  stairs in or around the home?: (Patient-Rptd) No Home free of loose throw rugs in walkways, pet beds, electrical cords, etc?: (Patient-Rptd) Yes Adequate lighting in your home to reduce risk of falls?: (Patient-Rptd) Yes Life alert?: (Patient-Rptd) No Use of a cane, walker or w/c?: (Patient-Rptd) No Grab bars in the bathroom?: (Patient-Rptd) No Elevated toilet seat or a handicapped toilet?: (Patient-Rptd) No  TIMED UP AND GO:  Was the test performed?  No    Cognitive Function:        04/30/2023    1:22 PM  6CIT Screen  What Year? 0 points  What month? 0 points  What time? 0 points  Count back from 20 0 points  Months in reverse 0 points  Repeat phrase 0 points  Total Score 0 points    Immunizations Immunization History  Administered Date(s) Administered   Fluad Trivalent(High Dose 65+) 01/16/2023   Hepatitis B 04/21/2010, 09/20/2010, 12/21/2010   Influenza, High Dose Seasonal PF 01/17/2022   Influenza,inj,Quad PF,6+ Mos 02/13/2019, 02/10/2020, 01/24/2021   Influenza-Unspecified 02/19/2014, 01/09/2015   PFIZER Comirnaty(Gray Top)Covid-19 Tri-Sucrose Vaccine 08/02/2020, 01/17/2022   PFIZER(Purple Top)SARS-COV-2 Vaccination 06/25/2019, 07/16/2019, 01/03/2020, 08/02/2020   Pfizer Covid-19 Vaccine Bivalent Booster 45yrs & up 01/24/2021   Pfizer(Comirnaty)Fall Seasonal Vaccine 12 years and older 01/16/2023   Pneumococcal Conjugate-13 02/13/2019   Pneumococcal Polysaccharide-23 05/09/2022   Respiratory Syncytial Virus Vaccine,Recomb Aduvanted(Arexvy) 01/20/2022   Tdap 04/21/2010   Zoster Recombinant(Shingrix) 06/24/2018, 11/25/2018   Zoster, Live 03/23/2015    TDAP status: Up to date  Flu Vaccine status: Up to date  Pneumococcal vaccine status: Up to date  Covid-19 vaccine status: Completed vaccines  Qualifies for Shingles Vaccine? Yes   Zostavax completed Yes   Shingrix Completed?:  Yes  Screening Tests Health Maintenance  Topic Date Due   Colonoscopy  08/10/2023    COVID-19 Vaccine (9 - 2024-25 season) 07/20/2023 (Originally 03/13/2023)   DTaP/Tdap/Td (2 - Td or Tdap) 04/29/2024 (Originally 04/21/2020)   Medicare Annual Wellness (AWV)  04/29/2024   Pneumonia Vaccine 70+ Years old  Completed   INFLUENZA VACCINE  Completed   Hepatitis C Screening  Completed   Zoster Vaccines- Shingrix  Completed   HPV VACCINES  Aged Out    Health Maintenance  Health Maintenance Due  Topic Date Due   Colonoscopy  08/10/2023    Colorectal cancer screening: Referral to GI placed yes. Pt aware the office will call re: appt.  Lung Cancer Screening: (Low Dose CT Chest recommended if Age 7-80 years, 20 pack-year currently smoking OR have quit w/in 15years.) does not qualify.   Lung Cancer Screening Referral: no  Additional Screening:  Hepatitis C Screening: does not qualify; Completed 08/07/2015  Vision Screening: Recommended annual ophthalmology exams for early detection of glaucoma and other disorders of the eye. Is the patient up to date with their annual eye exam?  No pt says goes every 2 years Who is the provider or what is the name of the office in which the patient attends annual eye exams? Dr Joshua If pt is not established with a provider, would they like to be referred to a provider to establish care? No .   Dental Screening: Recommended annual dental exams for proper oral hygiene  Diabetic Foot Exam: n/a  Community Resource Referral / Chronic Care Management: CRR required this visit?  No   CCM required this visit?  No    Plan:     I have personally reviewed and noted the following in the patient's chart:   Medical and social history Use of alcohol, tobacco or illicit drugs  Current medications and supplements including opioid prescriptions. Patient is not currently taking opioid prescriptions. Functional ability and status Nutritional status Physical activity Advanced directives List of other physicians Hospitalizations, surgeries, and  ER visits in previous 12 months Vitals Screenings to include cognitive, depression, and falls Referrals and appointments  In addition, I have reviewed and discussed with patient certain preventive protocols, quality metrics, and best practice recommendations. A written personalized care plan for preventive services as well as general preventive health recommendations were provided to patient.     Randy LITTIE Saris, LPN   [id number]/[id number]   After Visit Summary: (MyChart) Due to this being a telephonic visit, the after visit summary with patients personalized plan was offered to patient via MyChart   Nurse Notes: The patient states he is doing well and has no concerns or questions at this time.

## 2023-05-12 ENCOUNTER — Encounter: Payer: Self-pay | Admitting: Family Medicine

## 2023-05-12 ENCOUNTER — Ambulatory Visit (INDEPENDENT_AMBULATORY_CARE_PROVIDER_SITE_OTHER): Payer: PPO | Admitting: Family Medicine

## 2023-05-12 VITALS — BP 124/82 | HR 60 | Temp 97.6°F | Ht 67.0 in | Wt 201.4 lb

## 2023-05-12 DIAGNOSIS — Z8249 Family history of ischemic heart disease and other diseases of the circulatory system: Secondary | ICD-10-CM

## 2023-05-12 DIAGNOSIS — Z0000 Encounter for general adult medical examination without abnormal findings: Secondary | ICD-10-CM

## 2023-05-12 DIAGNOSIS — I77810 Thoracic aortic ectasia: Secondary | ICD-10-CM

## 2023-05-12 DIAGNOSIS — I2510 Atherosclerotic heart disease of native coronary artery without angina pectoris: Secondary | ICD-10-CM

## 2023-05-12 DIAGNOSIS — M1A00X0 Idiopathic chronic gout, unspecified site, without tophus (tophi): Secondary | ICD-10-CM

## 2023-05-12 DIAGNOSIS — E66811 Obesity, class 1: Secondary | ICD-10-CM

## 2023-05-12 DIAGNOSIS — Z125 Encounter for screening for malignant neoplasm of prostate: Secondary | ICD-10-CM | POA: Diagnosis not present

## 2023-05-12 DIAGNOSIS — E785 Hyperlipidemia, unspecified: Secondary | ICD-10-CM | POA: Diagnosis not present

## 2023-05-12 DIAGNOSIS — N529 Male erectile dysfunction, unspecified: Secondary | ICD-10-CM | POA: Diagnosis not present

## 2023-05-12 DIAGNOSIS — I10 Essential (primary) hypertension: Secondary | ICD-10-CM | POA: Diagnosis not present

## 2023-05-12 DIAGNOSIS — Z7189 Other specified counseling: Secondary | ICD-10-CM

## 2023-05-12 LAB — PSA: PSA: 2.27 ng/mL (ref 0.10–4.00)

## 2023-05-12 LAB — COMPREHENSIVE METABOLIC PANEL
ALT: 18 U/L (ref 0–53)
AST: 21 U/L (ref 0–37)
Albumin: 4.6 g/dL (ref 3.5–5.2)
Alkaline Phosphatase: 45 U/L (ref 39–117)
BUN: 12 mg/dL (ref 6–23)
CO2: 28 meq/L (ref 19–32)
Calcium: 9.3 mg/dL (ref 8.4–10.5)
Chloride: 102 meq/L (ref 96–112)
Creatinine, Ser: 0.91 mg/dL (ref 0.40–1.50)
GFR: 86.78 mL/min (ref >60.00)
Glucose, Bld: 89 mg/dL (ref 70–99)
Potassium: 4.3 meq/L (ref 3.5–5.1)
Sodium: 139 meq/L (ref 135–145)
Total Bilirubin: 0.8 mg/dL (ref 0.2–1.2)
Total Protein: 7.1 g/dL (ref 6.0–8.3)

## 2023-05-12 LAB — LIPID PANEL
Cholesterol: 152 mg/dL (ref 0–200)
HDL: 44.6 mg/dL (ref >39.00)
LDL Cholesterol: 90 mg/dL (ref 0–99)
NonHDL: 107.42
Total CHOL/HDL Ratio: 3
Triglycerides: 88 mg/dL (ref 0.0–149.0)
VLDL: 17.6 mg/dL (ref 0.0–40.0)

## 2023-05-12 LAB — URIC ACID: Uric Acid, Serum: 5.8 mg/dL (ref 4.0–7.8)

## 2023-05-12 MED ORDER — ALLOPURINOL 100 MG PO TABS: 100.0000 mg | ORAL_TABLET | Freq: Every day | ORAL | 4 refills | Status: DC

## 2023-05-12 MED ORDER — COLCHICINE 0.6 MG PO TABS: 0.6000 mg | ORAL_TABLET | Freq: Every day | ORAL | 3 refills | Status: AC | PRN

## 2023-05-12 MED ORDER — AMLODIPINE BESYLATE 10 MG PO TABS: 10.0000 mg | ORAL_TABLET | Freq: Every day | ORAL | 4 refills | Status: DC

## 2023-05-12 MED ORDER — LOVASTATIN 40 MG PO TABS: ORAL_TABLET | ORAL | 4 refills | Status: DC

## 2023-05-12 MED ORDER — SILDENAFIL CITRATE 100 MG PO TABS: 100.0000 mg | ORAL_TABLET | ORAL | 3 refills | Status: DC | PRN

## 2023-05-12 NOTE — Assessment & Plan Note (Signed)
Preventative protocols reviewed and updated unless pt declined. Discussed healthy diet and lifestyle.  

## 2023-05-12 NOTE — Assessment & Plan Note (Signed)
Chronic, stable on amlodipine - continue.

## 2023-05-12 NOTE — Assessment & Plan Note (Signed)
Encouraged healthy diet and lifestyle choices to affect sustainable weight loss.  ?

## 2023-05-12 NOTE — Patient Instructions (Addendum)
Labs today  You will be due for colonoscopy 07/2023.  We will call you to schedule repeat heart ultrasound  Advanced directive packet provided today  Return as needed or in 1 year for next physical/wellness visit  Good to see you today

## 2023-05-12 NOTE — Assessment & Plan Note (Signed)
Advanced directives - discussed, no living will set up. Packet previously provided, again today. Oldest daughter would be HCPOA.

## 2023-05-12 NOTE — Assessment & Plan Note (Signed)
Refilled viagra which is effective - takes 1/2 tab PRN

## 2023-05-12 NOTE — Assessment & Plan Note (Signed)
No recent gout flares- refilled allopurinol 100mg  daily, with PRN colchicine (sparing use)

## 2023-05-12 NOTE — Assessment & Plan Note (Signed)
Chronic, stable on lovastatin - update labs. Lp(a) 55 normal 04/2022. Coronary calcium score 80.  The 10-year ASCVD risk score (Arnett DK, et al., 2019) is: 16.2%   Values used to calculate the score:     Age: 68 years     Sex: Male     Is Non-Hispanic African American: No     Diabetic: No     Tobacco smoker: No     Systolic Blood Pressure: 124 mmHg     Is BP treated: Yes     HDL Cholesterol: 51.1 mg/dL     Total Cholesterol: 180 mg/dL

## 2023-05-12 NOTE — Progress Notes (Signed)
Ph: [phone] Fax: [phone]   Patient ID: DMIR RIEF, male    DOB: 12/25/1955, 68 y.o.   MRN: [id number]  This visit was conducted in person.  BP 124/82   Pulse 60   Temp 97.6 F (36.4 C) (Oral)   Ht 5\' 7"  (1.702 m)   Wt 201 lb 6 oz (91.3 kg)   SpO2 99%   BMI 31.54 kg/m    CC: CPE Subjective:   HPI: Randy Decker is a 68 y.o. male presenting on 05/12/2023 for Annual Exam (MCR prt 2 [AWV- 04/30/23]. )   Saw health advisor last week for medicare wellness visit. Note reviewed.   No results found.  Flowsheet Row Clinical Support from 04/30/2023 in [facility] [place]  PHQ-2 Total Score 0          04/29/2023    3:00 PM 05/09/2022    9:45 AM 05/07/2021    8:53 AM 05/02/2020    9:18 AM  Fall Risk   Falls in the past year? 0 0 1 0  Comment   Tripped   Number falls in past yr: 0  0 0  Injury with Fall? 0  0 0  Risk for fall due to : No Fall Risks     Follow up Education provided;Falls prevention discussed   Falls evaluation completed   S/p lap cholecystectomy 04/2021 for symptomatic gallstones (Stechschulte). Incidentally noted 2cm parapelvic left kidney cyst.   Gout - on allopurinol 100mg  daily with significant improvement of flares. No recent colchicine use.  ED - sildenafil 20mg  PRN.   Fmhx AAA (father) - pt had normal vascular screenings through [facility] 08/2016.  Fmhx CAD - father MI age 16. Coronary calcium score 80 (51%) (05/220). Saw cardiologist Dr Jacques Navy restarted on aspirin 81mg  daily along with continued lovastatin 40mg  daily. Had borderline dilation of ascending aorta 38mm from echo 05/2021.   Preventative: COLONOSCOPY Date: 07/2013 WNL Leone Payor)  Prostate cancer screening - discussed, continue screening, no BPH sxs  Lung cancer screening - not eligible  Flu shot - yearly  COVID Pfizer 06/2019 x2, 12/2019, 07/2020, bivalent booster 01/2021, 9/202 Tdap 2012  Prevnar-13 2020, pneumovax 04/2022 RSV 01/2022 Hep B series  2012 zostavax 2016 shingrix - 06/2018, 11/2018  Advanced directives - discussed, no living will set up. Packet previously provided, again today. Oldest daughter would be HCPOA.  Seat belt use discussed.  Sunscreen use discussed. No changing moles on skin.  Ex smoker - quit remotely 1985  Alcohol - very little  Dentist - 1-2 times a year Eye exam - due  Bowel - no constipation Bladder - no incontinence  Lives alone, 2 cats and 1 dog   Occupation: maintenance Psychologist, clinical) - furloughed 2020, now retired Edu: Automotive engineer  Activity: walking daily up to 2 hours in good weather, walking on elliptical or outside daily  Diet: good water, drinks sugar free powerade, fruits/vegetables regularly, rare meat      Relevant past medical, surgical, family and social history reviewed and updated as indicated. Interim medical history since our last visit reviewed. Allergies and medications reviewed and updated. Outpatient Medications Prior to Visit  Medication Sig Dispense Refill   aspirin EC 81 MG tablet Take 1 tablet (81 mg total) by mouth daily. Swallow whole. 90 tablet 3   allopurinol (ZYLOPRIM) 100 MG tablet Take 1 tablet (100 mg total) by mouth daily. 90 tablet 4   amLODipine (NORVASC) 10 MG tablet Take 1 tablet (10 mg  total) by mouth daily. 90 tablet 4   colchicine 0.6 MG tablet TAKE 1 TABLET BY MOUTH DAILY AS NEEDED (GOUT FLARE). 1ST DAY OF GOUT FLARE MAY TAKE 2 TABLETS @ ONCE 90 tablet 0   lovastatin (MEVACOR) 40 MG tablet TAKE 1 TABLET BY MOUTH EVERYDAY AT BEDTIME 90 tablet 4   sildenafil (VIAGRA) 100 MG tablet TAKE 0.5 TO 1 TABLET BY MOUTH DAILY AS NEEDED FOR ERECTILE DYSFUNCTION (INS. COVERS 3 IN 31 DAYS). 3 tablet 8   fluticasone (CUTIVATE) 0.05 % cream Apply topically daily as needed.     No facility-administered medications prior to visit.     Per HPI unless specifically indicated in ROS section below Review of Systems  Constitutional:  Negative for activity change, appetite change,  chills, fatigue, fever and unexpected weight change.  HENT:  Negative for hearing loss.   Eyes:  Negative for visual disturbance.  Respiratory:  Negative for cough, chest tightness, shortness of breath and wheezing.   Cardiovascular:  Negative for chest pain, palpitations and leg swelling.  Gastrointestinal:  Negative for abdominal distention, abdominal pain, blood in stool, constipation, diarrhea, nausea and vomiting.  Genitourinary:  Negative for difficulty urinating and hematuria.  Musculoskeletal:  Negative for arthralgias, myalgias and neck pain.  Skin:  Negative for rash.  Neurological:  Negative for dizziness, seizures, syncope and headaches.  Hematological:  Negative for adenopathy. Does not bruise/bleed easily.  Psychiatric/Behavioral:  Negative for dysphoric mood. The patient is not nervous/anxious.     Objective:  BP 124/82   Pulse 60   Temp 97.6 F (36.4 C) (Oral)   Ht 5\' 7"  (1.702 m)   Wt 201 lb 6 oz (91.3 kg)   SpO2 99%   BMI 31.54 kg/m   Wt Readings from Last 3 Encounters:  05/12/23 201 lb 6 oz (91.3 kg)  04/30/23 205 lb (93 kg)  05/14/22 205 lb 12.8 oz (93.4 kg)      Physical Exam Vitals and nursing note reviewed.  Constitutional:      General: He is not in acute distress.    Appearance: Normal appearance. He is well-developed. He is not ill-appearing.  HENT:     Head: Normocephalic and atraumatic.     Right Ear: Hearing, tympanic membrane, ear canal and external ear normal.     Left Ear: Hearing, tympanic membrane, ear canal and external ear normal.     Nose: No congestion.     Mouth/Throat:     Mouth: Mucous membranes are moist.     Pharynx: Oropharynx is clear. No posterior oropharyngeal erythema.  Eyes:     General: No scleral icterus.    Extraocular Movements: Extraocular movements intact.     Conjunctiva/sclera: Conjunctivae normal.     Pupils: Pupils are equal, round, and reactive to light.  Neck:     Thyroid: No thyroid mass or thyromegaly.      Vascular: No carotid bruit.  Cardiovascular:     Rate and Rhythm: Normal rate and regular rhythm.     Pulses: Normal pulses.          Radial pulses are 2+ on the right side and 2+ on the left side.     Heart sounds: Normal heart sounds. No murmur heard. Pulmonary:     Effort: Pulmonary effort is normal. No respiratory distress.     Breath sounds: Normal breath sounds. No wheezing, rhonchi or rales.  Abdominal:     General: Bowel sounds are normal. There is no distension.  Palpations: Abdomen is soft. There is no mass.     Tenderness: There is no abdominal tenderness. There is no guarding or rebound.     Hernia: No hernia is present.  Musculoskeletal:        General: Normal range of motion.     Cervical back: Normal range of motion and neck supple.     Right lower leg: No edema.     Left lower leg: No edema.  Lymphadenopathy:     Cervical: No cervical adenopathy.  Skin:    General: Skin is warm and dry.     Findings: No rash.  Neurological:     General: No focal deficit present.     Mental Status: He is alert and oriented to person, place, and time.  Psychiatric:        Mood and Affect: Mood normal.        Behavior: Behavior normal.        Thought Content: Thought content normal.        Judgment: Judgment normal.       Results for orders placed or performed in visit on 05/09/22  Lipid panel   Collection Time: 05/09/22  9:36 AM  Result Value Ref Range   Cholesterol 180 0 - 200 mg/dL   Triglycerides 21.3 0.0 - 149.0 mg/dL   HDL 08.65 >78.46 mg/dL   VLDL 96.2 0.0 - 95.2 mg/dL   LDL Cholesterol 841 (H) 0 - 99 mg/dL   Total CHOL/HDL Ratio 4    NonHDL 128.85   Comprehensive metabolic panel   Collection Time: 05/09/22  9:36 AM  Result Value Ref Range   Sodium 140 135 - 145 mEq/L   Potassium 4.1 3.5 - 5.1 mEq/L   Chloride 102 96 - 112 mEq/L   CO2 29 19 - 32 mEq/L   Glucose, Bld 94 70 - 99 mg/dL   BUN 10 6 - 23 mg/dL   Creatinine, Ser 3.24 0.40 - 1.50 mg/dL   Total  Bilirubin 0.6 0.2 - 1.2 mg/dL   Alkaline Phosphatase 45 39 - 117 U/L   AST 21 0 - 37 U/L   ALT 23 0 - 53 U/L   Total Protein 7.0 6.0 - 8.3 g/dL   Albumin 4.4 3.5 - 5.2 g/dL   GFR 40.10 >27.25 mL/min   Calcium 8.8 8.4 - 10.5 mg/dL  Uric acid   Collection Time: 05/09/22  9:36 AM  Result Value Ref Range   Uric Acid, Serum 6.4 4.0 - 7.8 mg/dL  Lipoprotein A (LPA)   Collection Time: 05/09/22  9:36 AM  Result Value Ref Range   Lipoprotein (a) 55 <75 nmol/L  PSA   Collection Time: 05/09/22  9:36 AM  Result Value Ref Range   PSA 1.58 0.10 - 4.00 ng/mL      04/30/2023    1:12 PM 05/09/2022    9:45 AM 05/07/2021    8:54 AM 05/02/2020    9:18 AM 10/05/2019   10:33 AM  Depression screen PHQ 2/9  Decreased Interest 0 0 0 0 0  Down, Depressed, Hopeless 0 0 0 0 0  PHQ - 2 Score 0 0 0 0 0  Altered sleeping  0     Tired, decreased energy  1     Change in appetite  0     Feeling bad or failure about yourself   0     Trouble concentrating  0     Moving slowly or fidgety/restless  0  Suicidal thoughts  0     PHQ-9 Score  1     Difficult doing work/chores  Not difficult at all          05/09/2022    9:46 AM 05/02/2020    9:18 AM  GAD 7 : Generalized Anxiety Score  Nervous, Anxious, on Edge 1 0  Control/stop worrying 0 0  Worry too much - different things 0 0  Trouble relaxing 0 0  Restless 0 0  Easily annoyed or irritable 0 0  Afraid - awful might happen 0 0  Total GAD 7 Score 1 0  Anxiety Difficulty Not difficult at all Not difficult at all   Assessment & Plan:   Problem List Items Addressed This Visit     Health maintenance examination - Primary (Chronic)   Preventative protocols reviewed and updated unless pt declined. Discussed healthy diet and lifestyle.       Advanced directives, counseling/discussion (Chronic)   Advanced directives - discussed, no living will set up. Packet previously provided, again today. Oldest daughter would be HCPOA.       HLD (hyperlipidemia)    Chronic, stable on lovastatin - update labs. Lp(a) 55 normal 04/2022. Coronary calcium score 80.  The 10-year ASCVD risk score (Arnett DK, et al., 2019) is: 16.2%   Values used to calculate the score:     Age: 30 years     Sex: Male     Is Non-Hispanic African American: No     Diabetic: No     Tobacco smoker: No     Systolic Blood Pressure: 124 mmHg     Is BP treated: Yes     HDL Cholesterol: 51.1 mg/dL     Total Cholesterol: 180 mg/dL       Relevant Medications   amLODipine (NORVASC) 10 MG tablet   lovastatin (MEVACOR) 40 MG tablet   sildenafil (VIAGRA) 100 MG tablet   Other Relevant Orders   Lipid panel   Comprehensive metabolic panel   Essential hypertension   Chronic, stable on amlodipine - continue      Relevant Medications   amLODipine (NORVASC) 10 MG tablet   lovastatin (MEVACOR) 40 MG tablet   sildenafil (VIAGRA) 100 MG tablet   Obesity, Class I, BMI 30-34.9   Encouraged healthy diet and lifestyle choices to affect sustainable weight loss       Erectile dysfunction   Refilled viagra which is effective - takes 1/2 tab PRN       Relevant Medications   sildenafil (VIAGRA) 100 MG tablet   Gout   No recent gout flares- refilled allopurinol 100mg  daily, with PRN colchicine (sparing use)      Relevant Medications   allopurinol (ZYLOPRIM) 100 MG tablet   colchicine 0.6 MG tablet   Other Relevant Orders   Uric acid   Dilated aortic root (HCC)   Has not seen cardiology recently.  Update echocardiogram.  Aim for good BP control.       Relevant Medications   amLODipine (NORVASC) 10 MG tablet   lovastatin (MEVACOR) 40 MG tablet   sildenafil (VIAGRA) 100 MG tablet   Other Relevant Orders   ECHOCARDIOGRAM COMPLETE   Coronary artery calcification   Saw cardiology, on lovastatin and aspirin 81mg  daily.       Relevant Medications   amLODipine (NORVASC) 10 MG tablet   lovastatin (MEVACOR) 40 MG tablet   sildenafil (VIAGRA) 100 MG tablet   Family history of  premature CAD   Other Visit  Diagnoses       Special screening for malignant neoplasm of prostate       Relevant Orders   PSA        Meds ordered this encounter  Medications   allopurinol (ZYLOPRIM) 100 MG tablet    Sig: Take 1 tablet (100 mg total) by mouth daily.    Dispense:  90 tablet    Refill:  4   amLODipine (NORVASC) 10 MG tablet    Sig: Take 1 tablet (10 mg total) by mouth daily.    Dispense:  90 tablet    Refill:  4   colchicine 0.6 MG tablet    Sig: Take 1 tablet (0.6 mg total) by mouth daily as needed. May take 2 tablets on first day    Dispense:  30 tablet    Refill:  3   lovastatin (MEVACOR) 40 MG tablet    Sig: TAKE 1 TABLET BY MOUTH EVERYDAY AT BEDTIME    Dispense:  90 tablet    Refill:  4   sildenafil (VIAGRA) 100 MG tablet    Sig: Take 1 tablet (100 mg total) by mouth as needed for erectile dysfunction.    Dispense:  10 tablet    Refill:  3    Orders Placed This Encounter  Procedures   Lipid panel   Comprehensive metabolic panel   PSA   Uric acid   ECHOCARDIOGRAM COMPLETE    Standing Status:   Future    Expiration Date:   05/11/2024    Where should this test be performed:   [facility]    Perflutren DEFINITY (image enhancing agent) should be administered unless hypersensitivity or allergy exist:   Administer Perflutren    Reason for exam-Echo:   Ascending aortic aneurysm I71    Other Comments:   dilated aortic root follow up    Patient Instructions  Labs today  You will be due for colonoscopy 07/2023.  We will call you to schedule repeat heart ultrasound  Advanced directive packet provided today  Return as needed or in 1 year for next physical/wellness visit  Good to see you today  Follow up plan: Return in about 1 year (around 05/11/2024) for annual exam, prior fasting for blood work, medicare wellness visit.  Eustaquio Boyden, MD

## 2023-05-12 NOTE — Assessment & Plan Note (Signed)
Saw cardiology, on lovastatin and aspirin 81mg  daily.

## 2023-05-12 NOTE — Assessment & Plan Note (Signed)
Has not seen cardiology recently.  Update echocardiogram.  Aim for good BP control.

## 2023-05-18 ENCOUNTER — Encounter: Payer: Self-pay | Admitting: Family Medicine

## 2023-06-26 ENCOUNTER — Encounter: Payer: Self-pay | Admitting: Internal Medicine

## 2023-07-03 IMAGING — US US ABDOMEN COMPLETE
1 series · 14 of 25 positions shown · non-contrast
Comparison: None.

CLINICAL DATA: Epigastric pain.

EXAM:
ABDOMEN ULTRASOUND COMPLETE

[Series 1: us abdomen complete · 0.26mm/px · 14 of 102 slices shown]
[im 1/102]
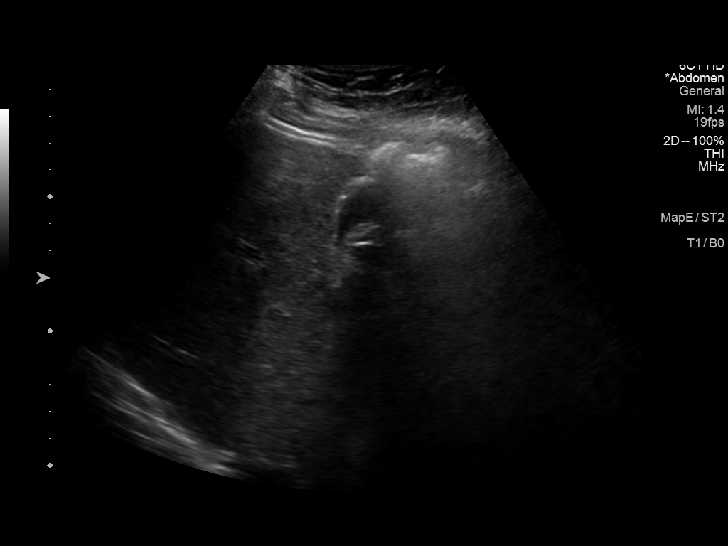
[im 9/102]
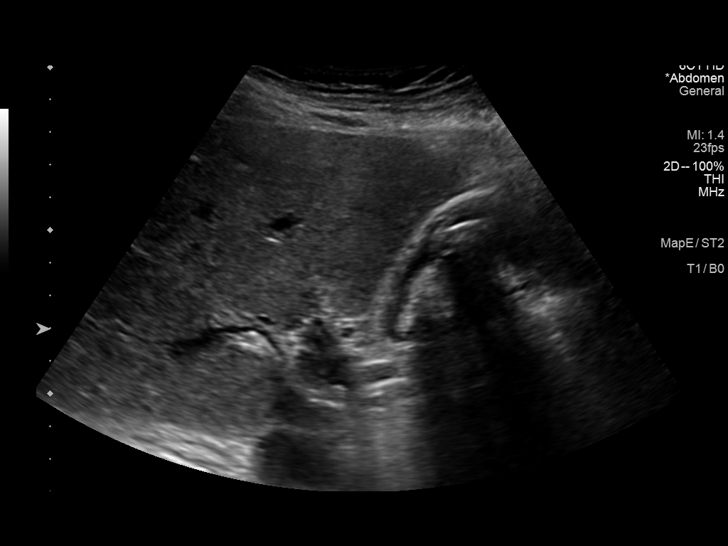
[im 17/102]
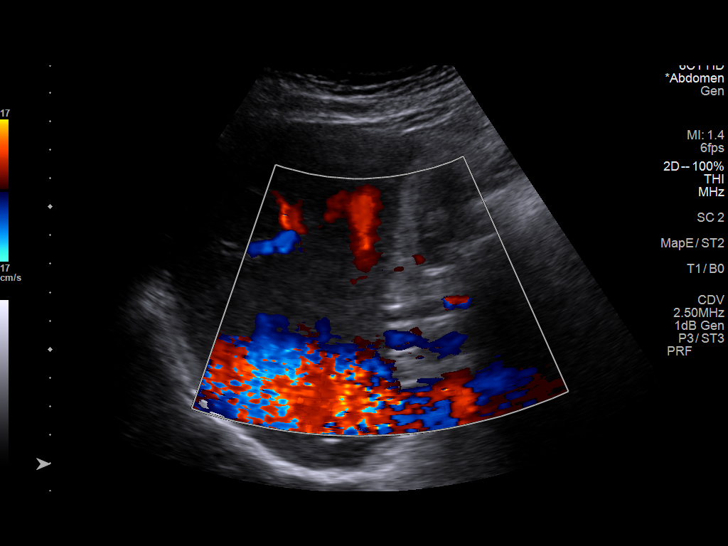
[im 26/102]
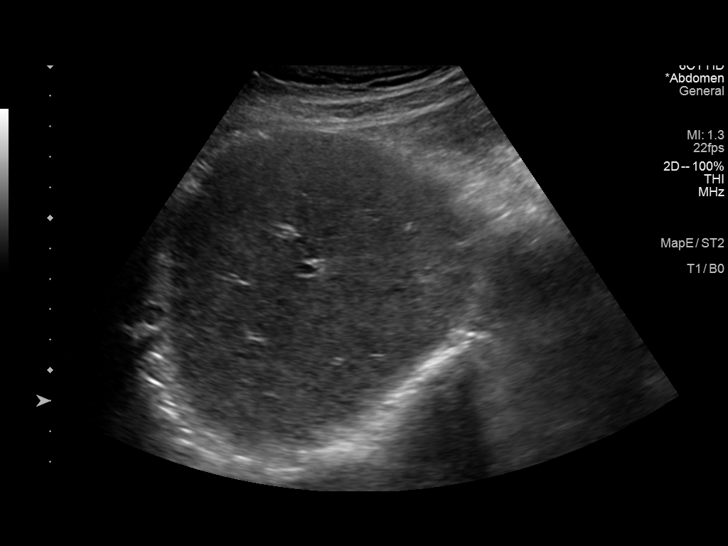
[im 34/102]
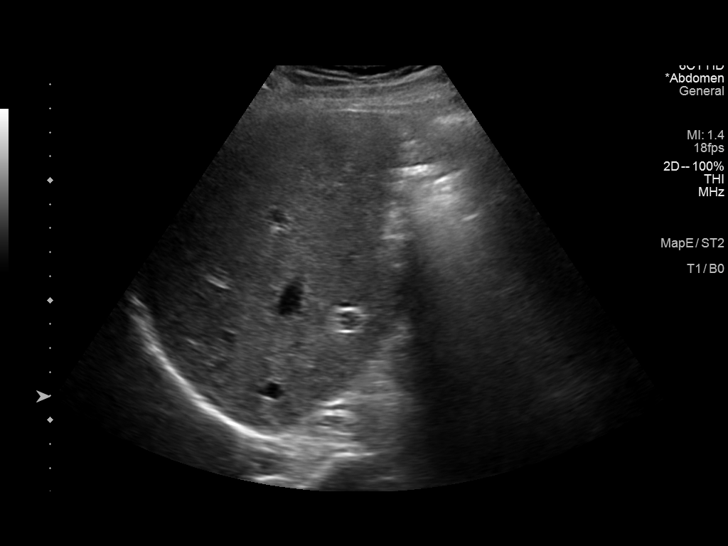
[im 38/102]
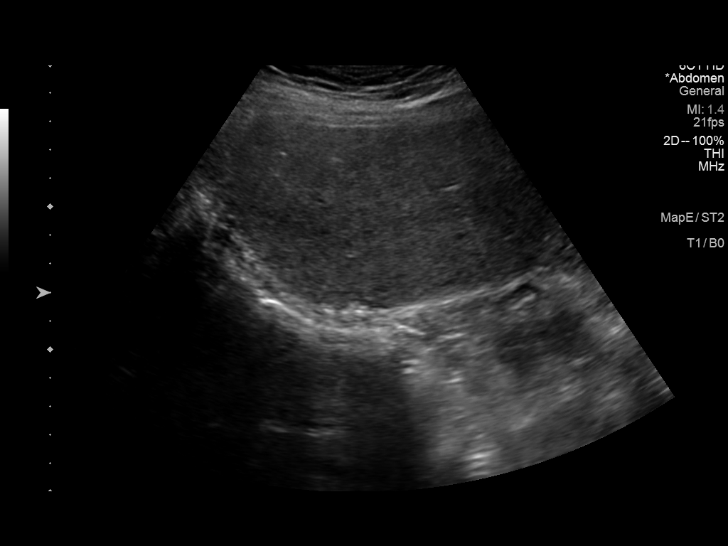
[im 47/102]
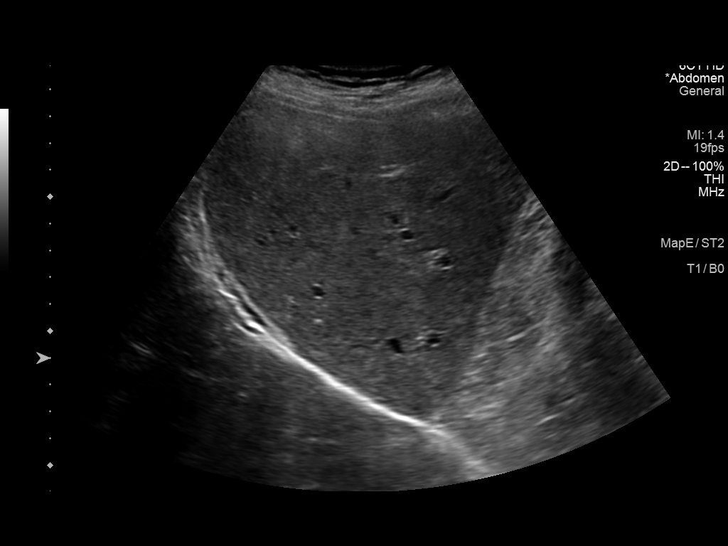
[im 55/102]
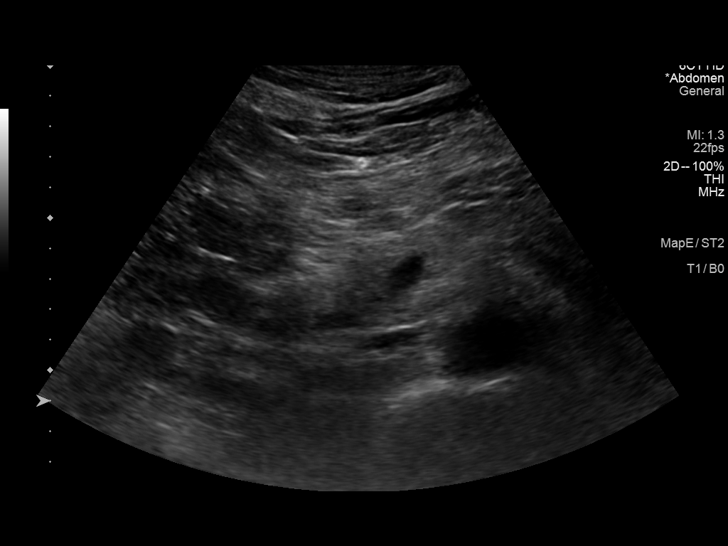
[im 64/102]
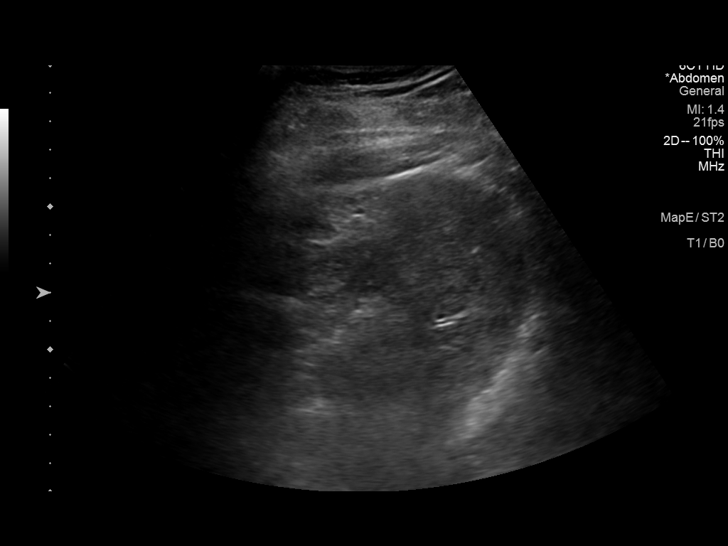
[im 68/102]
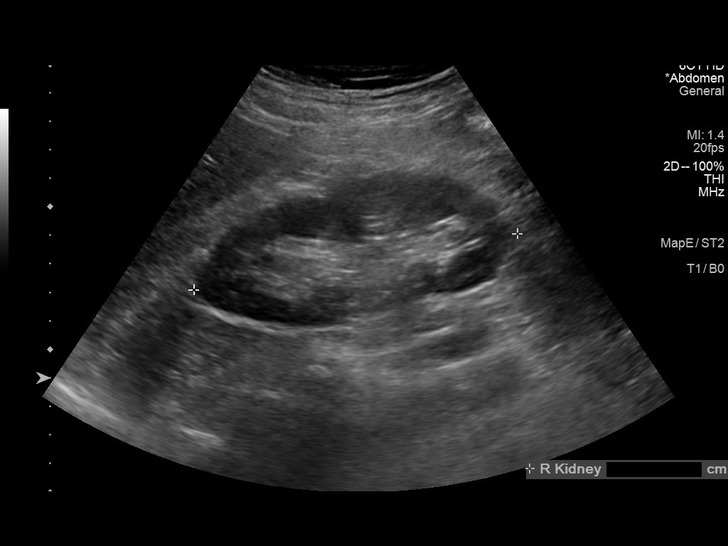
[im 76/102]
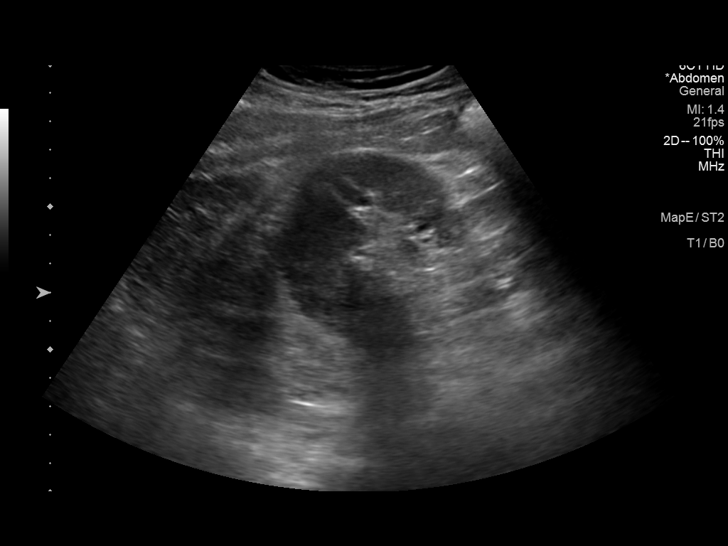
[im 85/102]
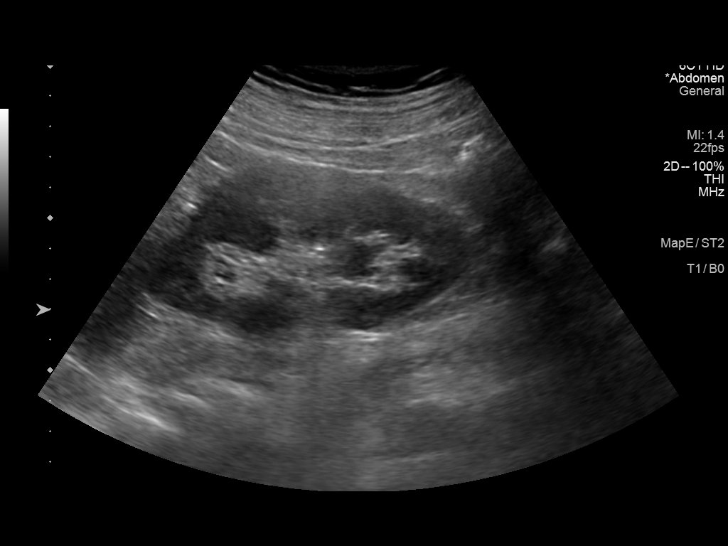
[im 93/102]
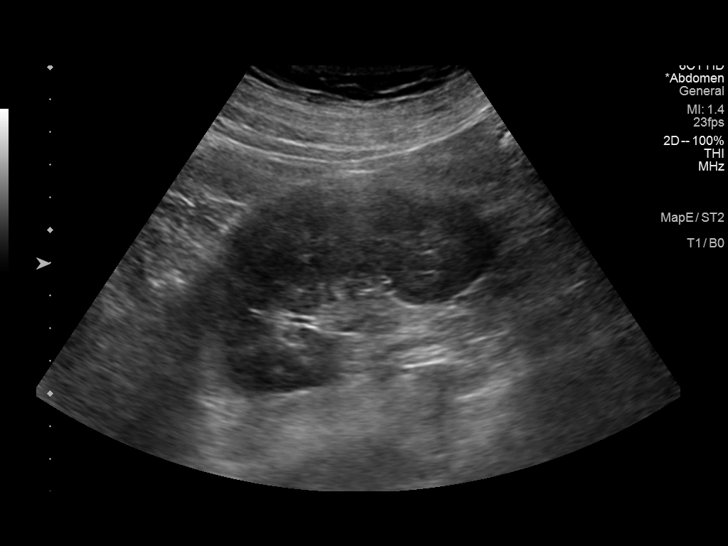
[im 102/102]
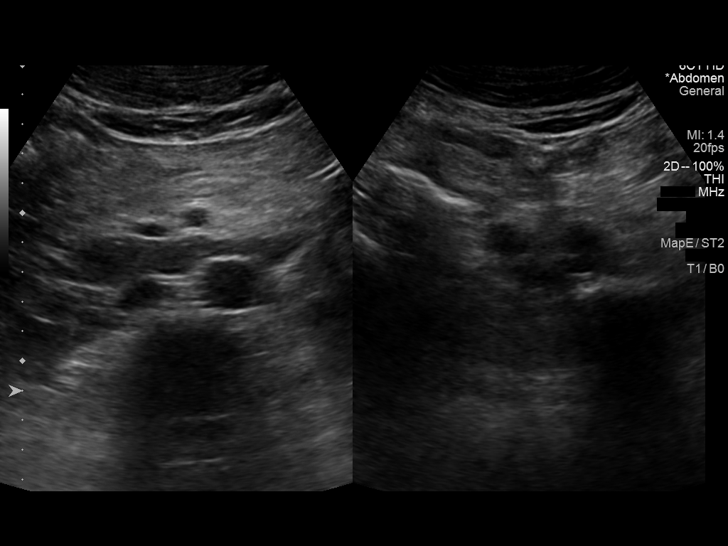

[14 of 25 positions shown; findings below may reference images not displayed]

FINDINGS: Gallbladder: Multiple shadowing intraluminal gallstones are obscured
the posterior wall. Largest stone measures 2 cm. No gallbladder wall
thickening or pericholecystic fluid. No sonographic Murphy sign
noted by sonographer.

Common bile duct: Diameter: 4 mm, normal.

Liver: No focal lesion identified. Within normal limits in
parenchymal echogenicity. Portal vein is patent on color Doppler
imaging with normal direction of blood flow towards the liver.

IVC: No abnormality visualized.

Pancreas: Visualized portion unremarkable.

Spleen: Size and appearance within normal limits.

Right Kidney: Length: 11.5 cm. Echogenicity within normal limits. No
mass or hydronephrosis visualized.

Left Kidney: Length: 10.8 cm. Echogenicity within normal limits.
Lower pole parapelvic cyst measures 2 cm. No solid mass or
hydronephrosis visualized.

Abdominal aorta: No aneurysm visualized.

Other findings: No abdominal ascites.
IMPRESSION: 1. Multiple gallstones without findings of acute cholecystitis. No
biliary dilatation.
2. Incidental 2 cm parapelvic cyst in the left kidney.

## 2023-07-21 HISTORY — PX: COLONOSCOPY: SHX174

## 2023-07-22 ENCOUNTER — Ambulatory Visit (AMBULATORY_SURGERY_CENTER): Admitting: *Deleted

## 2023-07-22 VITALS — Ht 67.0 in | Wt 200.0 lb

## 2023-07-22 DIAGNOSIS — Z1211 Encounter for screening for malignant neoplasm of colon: Secondary | ICD-10-CM

## 2023-07-22 MED ORDER — SUFLAVE 178.7 G PO SOLR
1.0000 | Freq: Once | ORAL | 0 refills | Status: AC
Start: 2023-07-22 — End: 2023-07-22

## 2023-07-22 NOTE — Progress Notes (Signed)
 Pt's name and DOB verified at the beginning of the pre-visit wit 2 identifiers  Pt denies any difficulty with ambulating,sitting, laying down or rolling side to side  Pt has no issues with ambulation   Pt has no issues moving head neck or swallowing  No egg or soy allergy known to patient   No issues known to pt with past sedation with any surgeries or procedures  Pt denies having issues being intubated  No FH of Malignant Hyperthermia  Pt is not on diet pills or shots  Pt is not on home 02   Pt is not on blood thinners   Pt denies issues with constipation   Pt is not on dialysis  Pt denise any abnormal heart rhythms   Pt denies any upcoming cardiac testing  Patient's chart reviewed by Cathlyn Parsons CNRA prior to pre-visit and patient appropriate for the [facility].  Pre-visit completed and red dot placed by patient's name on their procedure day (on provider's schedule).    Chart not reviewed by CRNA prior to [facility]  Visit by phone  Pt states weight is 200 lb  IInstructions reviewed. Pt given [facility], [facility] main # and MD on call # prior to instructions.  Pt states understanding of instructions. Instructed to review again prior to procedure. Pt states they will.   Informed pt that they will receive a text or  call from [facility] regarding there prep med.

## 2023-08-06 ENCOUNTER — Encounter: Payer: Self-pay | Admitting: Internal Medicine

## 2023-08-10 NOTE — Progress Notes (Signed)
 [facility] History and Physical   Primary Care Physician:  Claire Crick, MD   Reason for Procedure:   CRCA screening  Plan:    colonoscopy     HPI: Randy Decker is a 68 y.o. male here for repeat screening colonoscopy - had negative exam 2015   Past Medical History:  Diagnosis Date   Essential hypertension 02/27/2015   Gout attack    H/O measles    possible--can't remember   Hemorrhoids    History of chicken pox    HLD (hyperlipidemia)    Obesity, Class I, BMI 30-34.9 02/27/2015    Past Surgical History:  Procedure Laterality Date   CHOLECYSTECTOMY N/A 05/10/2021   Procedure: LAPAROSCOPIC CHOLECYSTECTOMY;  Surgeon: Junie Olds, MD;  Location: [facility];  Service: General;  Laterality: N/A;   COLONOSCOPY  2015   WNL (Yanett Conkright)   TONSILLECTOMY AND ADENOIDECTOMY  1964   UMBILICAL HERNIA REPAIR  2013    Prior to Admission medications   Medication Sig Start Date End Date Taking? Authorizing Provider  allopurinol  (ZYLOPRIM ) 100 MG tablet Take 1 tablet (100 mg total) by mouth daily. 05/12/23   Claire Crick, MD  amLODipine  (NORVASC ) 10 MG tablet Take 1 tablet (10 mg total) by mouth daily. 05/12/23   Claire Crick, MD  aspirin  EC 81 MG tablet Take 1 tablet (81 mg total) by mouth daily. Swallow whole. 04/04/21   Goodrich, Callie E, PA-C  chlorhexidine  (PERIDEX ) 0.12 % solution RINSE WITH 1/2 OUNCE TWICE A DAY AFTER BREAKFAST AND BEFORE BEDTIME 07/13/23   [provider]  colchicine  0.6 MG tablet Take 1 tablet (0.6 mg total) by mouth daily as needed. May take 2 tablets on first day 05/12/23   Gutierrez, Javier, MD  doxycycline (VIBRAMYCIN) 100 MG capsule Take 100 mg by mouth daily. 07/13/23   [provider]  lovastatin  (MEVACOR ) 40 MG tablet TAKE 1 TABLET BY MOUTH EVERYDAY AT BEDTIME 05/12/23   Claire Crick, MD  Multiple Vitamin (MULTIVITAMIN) capsule Take 1 capsule by mouth daily.    [provider]   sildenafil  (VIAGRA ) 100 MG tablet Take 1 tablet (100 mg total) by mouth as needed for erectile dysfunction. 05/12/23   Claire Crick, MD    Current Outpatient Medications  Medication Sig Dispense Refill   amLODipine  (NORVASC ) 10 MG tablet Take 1 tablet (10 mg total) by mouth daily. 90 tablet 4   aspirin  EC 81 MG tablet Take 1 tablet (81 mg total) by mouth daily. Swallow whole. 90 tablet 3   colchicine  0.6 MG tablet Take 1 tablet (0.6 mg total) by mouth daily as needed. May take 2 tablets on first day 30 tablet 3   doxycycline (VIBRAMYCIN) 100 MG capsule Take 100 mg by mouth daily.     lovastatin  (MEVACOR ) 40 MG tablet TAKE 1 TABLET BY MOUTH EVERYDAY AT BEDTIME 90 tablet 4   Multiple Vitamin (MULTIVITAMIN) capsule Take 1 capsule by mouth daily.     allopurinol  (ZYLOPRIM ) 100 MG tablet Take 1 tablet (100 mg total) by mouth daily. 90 tablet 4   chlorhexidine  (PERIDEX ) 0.12 % solution RINSE WITH 1/2 OUNCE TWICE A DAY AFTER BREAKFAST AND BEFORE BEDTIME     sildenafil  (VIAGRA ) 100 MG tablet Take 1 tablet (100 mg total) by mouth as needed for erectile dysfunction. 10 tablet 3   Current Facility-Administered Medications  Medication Dose Route Frequency Provider Last Rate Last Admin   0.9 %  sodium chloride  infusion  500 mL Intravenous Once Neilah Fulwider  E, MD        Allergies as of 08/11/2023   (No Known Allergies)    Family History  Problem Relation Age of Onset   Hypertension Father    Aortic aneurysm Father 2       deceased   Breast cancer Maternal Grandmother    Diabetes Neg Hx    Cancer Neg Hx    Stroke Neg Hx    Colon cancer Neg Hx    Colon polyps Neg Hx    Stomach cancer Neg Hx    Rectal cancer Neg Hx     Social History   Socioeconomic History   Marital status: Single    Spouse name: Not on file   Number of children: Not on file   Years of education: Not on file   Highest education level: Not on file  Occupational History   Not on file  Tobacco Use   Smoking  status: Former    Current packs/day: 0.00    Average packs/day: 1 pack/day for 5.0 years (5.0 ttl pk-yrs)    Types: Cigarettes    Start date: 04/21/1978    Quit date: 04/22/1983    Years since quitting: 40.3   Smokeless tobacco: Never   Tobacco comments:    Quit smoking 1985  Substance and Sexual Activity   Alcohol use: Not Currently    Comment: Occasional   Drug use: No   Sexual activity: Yes    Birth control/protection: None  Other Topics Concern   Not on file  Social History Narrative   Divorced.    Lives alone, 2 cats and 1 dog   Occupation: maintenance Psychologist, clinical)   Edu: Automotive engineer   Activity: active at work   Diet: some water, fruits/vegetables daily, rare meat   Social Drivers of Corporate investment banker Strain: Low Risk  (04/30/2023)   Overall Financial Resource Strain (CARDIA)    Difficulty of Paying Living Expenses: Not hard at all  Food Insecurity: No Food Insecurity (04/30/2023)   Hunger Vital Sign    Worried About Running Out of Food in the Last Year: Never true    Ran Out of Food in the Last Year: Never true  Transportation Needs: No Transportation Needs (04/30/2023)   PRAPARE - Administrator, Civil Service (Medical): No    Lack of Transportation (Non-Medical): No  Physical Activity: Insufficiently Active (04/30/2023)   Exercise Vital Sign    Days of Exercise per Week: 4 days    Minutes of Exercise per Session: 30 min  Stress: No Stress Concern Present (04/30/2023)   [employer] of Occupational Health - Occupational Stress Questionnaire    Feeling of Stress : Not at all  Social Connections: Unknown (04/30/2023)   Social Connection and Isolation Panel [NHANES]    Frequency of Communication with Friends and Family: Three times a week    Frequency of Social Gatherings with Friends and Family: Once a week    Attends Religious Services: Never    Database administrator or Organizations: No    Attends Banker Meetings: Never    Marital  Status: Not on file  Intimate Partner Violence: Not At Risk (04/30/2023)   Humiliation, Afraid, Rape, and Kick questionnaire    Fear of Current or Ex-Partner: No    Emotionally Abused: No    Physically Abused: No    Sexually Abused: No    Review of Systems:  All other review of systems negative except as mentioned  in the HPI.  Physical Exam: Vital signs BP 122/78   Pulse 67   Temp 98 F (36.7 C)   Ht 5\' 7"  (1.702 m)   Wt 200 lb (90.7 kg)   SpO2 96%   BMI 31.32 kg/m   General:   Alert,  Well-developed, well-nourished, pleasant and cooperative in NAD Lungs:  Clear throughout to auscultation.   Heart:  Regular rate and rhythm; no murmurs, clicks, rubs,  or gallops. Abdomen:  Soft, nontender and nondistended. Normal bowel sounds.   Neuro/Psych:  Alert and cooperative. Normal mood and affect. A and O x 3   @Augustine Leverette  Tammie Fall, MD, [facility] [phone] (pager) 08/11/2023 9:53 AM@

## 2023-08-11 ENCOUNTER — Encounter: Payer: Self-pay | Admitting: Internal Medicine

## 2023-08-11 ENCOUNTER — Ambulatory Visit (AMBULATORY_SURGERY_CENTER): Admitting: Internal Medicine

## 2023-08-11 VITALS — BP 101/66 | HR 60 | Temp 98.0°F | Resp 15 | Ht 67.0 in | Wt 200.0 lb

## 2023-08-11 DIAGNOSIS — I10 Essential (primary) hypertension: Secondary | ICD-10-CM | POA: Diagnosis not present

## 2023-08-11 DIAGNOSIS — D120 Benign neoplasm of cecum: Secondary | ICD-10-CM | POA: Diagnosis not present

## 2023-08-11 DIAGNOSIS — K5730 Diverticulosis of large intestine without perforation or abscess without bleeding: Secondary | ICD-10-CM | POA: Diagnosis not present

## 2023-08-11 DIAGNOSIS — K649 Unspecified hemorrhoids: Secondary | ICD-10-CM | POA: Diagnosis not present

## 2023-08-11 DIAGNOSIS — Z1211 Encounter for screening for malignant neoplasm of colon: Secondary | ICD-10-CM | POA: Diagnosis not present

## 2023-08-11 DIAGNOSIS — E66811 Obesity, class 1: Secondary | ICD-10-CM | POA: Diagnosis not present

## 2023-08-11 DIAGNOSIS — M109 Gout, unspecified: Secondary | ICD-10-CM | POA: Diagnosis not present

## 2023-08-11 MED ORDER — SODIUM CHLORIDE 0.9 % IV SOLN: 500.0000 mL | Freq: Once | INTRAVENOUS | Status: DC

## 2023-08-11 NOTE — Op Note (Signed)
 [facility] Patient Name: Randy Decker Procedure Date: 08/11/2023 9:56 AM MRN: [id number] Endoscopist: Kenney Peacemaker , MD, [id number] Age: 68 Referring MD:  Date of Birth: [birth year] Gender: Male Account #: [id number] Procedure:                Colonoscopy Indications:              Screening for colorectal malignant neoplasm, Last                            colonoscopy: 2015 Medicines:                Monitored Anesthesia Care Procedure:                Pre-Anesthesia Assessment:                           - Prior to the procedure, a History and Physical                            was performed, and patient medications and                            allergies were reviewed. The patient's tolerance of                            previous anesthesia was also reviewed. The risks                            and benefits of the procedure and the sedation                            options and risks were discussed with the patient.                            All questions were answered, and informed consent                            was obtained. Prior Anticoagulants: The patient has                            taken no anticoagulant or antiplatelet agents. ASA                            Grade Assessment: II - A patient with mild systemic                            disease. After reviewing the risks and benefits,                            the patient was deemed in satisfactory condition to                            undergo the procedure.  After obtaining informed consent, the colonoscope                            was passed under direct vision. Throughout the                            procedure, the patient's blood pressure, pulse, and                            oxygen saturations were monitored continuously. The                            PCF-HQ190L Colonoscope [id number] was introduced                            through the anus and advanced to the the  cecum,                            identified by appendiceal orifice and ileocecal                            valve. The colonoscopy was performed without                            difficulty. The patient tolerated the procedure                            well. The quality of the bowel preparation was                            adequate. The ileocecal valve, appendiceal orifice,                            and rectum were photographed. The bowel preparation                            used was SUFLAVE  via split dose instruction. Scope In: 10:01:05 AM Scope Out: 10:20:48 AM Scope Withdrawal Time: 0 hours 15 minutes 24 seconds  Total Procedure Duration: 0 hours 19 minutes 43 seconds  Findings:                 A diminutive polyp was found in the cecum. The                            polyp was sessile. The polyp was removed with a                            cold snare. Resection and retrieval were complete.                            Verification of patient identification for the                            specimen was done. Estimated blood  loss was minimal.                           Multiple diverticula were found in the sigmoid                            colon.                           Hemorrhoids were found on perianal exam.                           The exam was otherwise without abnormality on                            direct and retroflexion views. Complications:            No immediate complications. Estimated Blood Loss:     Estimated blood loss was minimal. Estimated blood                            loss was minimal. Impression:               - One diminutive polyp in the cecum, removed with a                            cold snare. Resected and retrieved.                           - Diverticulosis in the sigmoid colon.                           - Hemorrhoids found on perianal exam.                           - The examination was otherwise normal on direct                             and retroflexion views. Recommendation:           - Patient has a contact number available for                            emergencies. The signs and symptoms of potential                            delayed complications were discussed with the                            patient. Return to normal activities tomorrow.                            Written discharge instructions were provided to the                            patient.                           -  Resume previous diet.                           - Continue present medications.                           - Repeat colonoscopy is recommended. The                            colonoscopy date will be determined after pathology                            results from today's exam become available for                            review. Kenney Peacemaker, MD 08/11/2023 10:33:22 AM This report has been signed electronically.

## 2023-08-11 NOTE — Progress Notes (Signed)
 Called to room to assist during endoscopic procedure.  Patient ID and intended procedure confirmed with present staff. Received instructions for my participation in the procedure from the performing physician.

## 2023-08-11 NOTE — Progress Notes (Signed)
 Pt's states no medical or surgical changes since previsit or office visit.

## 2023-08-11 NOTE — Patient Instructions (Addendum)
 I found and removed one tiny polyp. I will let you know pathology results and when to have another routine colonoscopy by mail and/or My Chart.  You also have a condition called diverticulosis - common and not usually a problem. Please read the handout provided.  I appreciate the opportunity to care for you. Kenney Peacemaker, MD, FACG  YOU HAD AN ENDOSCOPIC PROCEDURE TODAY AT THE [facility]:   Refer to the procedure report that was given to you for any specific questions about what was found during the examination.  If the procedure report does not answer your questions, please call your gastroenterologist to clarify.  If you requested that your care partner not be given the details of your procedure findings, then the procedure report has been included in a sealed envelope for you to review at your convenience later.  YOU SHOULD EXPECT: Some feelings of bloating in the abdomen. Passage of more gas than usual.  Walking can help get rid of the air that was put into your GI tract during the procedure and reduce the bloating. If you had a lower endoscopy (such as a colonoscopy or flexible sigmoidoscopy) you may notice spotting of blood in your stool or on the toilet paper. If you underwent a bowel prep for your procedure, you may not have a normal bowel movement for a few days.  Please Note:  You might notice some irritation and congestion in your nose or some drainage.  This is from the oxygen used during your procedure.  There is no need for concern and it should clear up in a day or so.  SYMPTOMS TO REPORT IMMEDIATELY:  Following lower endoscopy (colonoscopy or flexible sigmoidoscopy):  Excessive amounts of blood in the stool  Significant tenderness or worsening of abdominal pains  Swelling of the abdomen that is new, acute  Fever of 100F or higher  Following upper endoscopy (EGD)  Vomiting of blood or coffee ground material  New chest pain or pain under the shoulder  blades  Painful or persistently difficult swallowing  New shortness of breath  Fever of 100F or higher  Black, tarry-looking stools  For urgent or emergent issues, a gastroenterologist can be reached at any hour by calling [phone]. Do not use MyChart messaging for urgent concerns.    DIET:  We do recommend a small meal at first, but then you may proceed to your regular diet.  Drink plenty of fluids but you should avoid alcoholic beverages for 24 hours.  ACTIVITY:  You should plan to take it easy for the rest of today and you should NOT DRIVE or use heavy machinery until tomorrow (because of the sedation medicines used during the test).    FOLLOW UP: Our staff will call the number listed on your records the next business day following your procedure.  We will call around 7:15- 8:00 am to check on you and address any questions or concerns that you may have regarding the information given to you following your procedure. If we do not reach you, we will leave a message.     If any biopsies were taken you will be contacted by phone or by letter within the next 1-3 weeks.  Please call us  at [phone] if you have not heard about the biopsies in 3 weeks.    SIGNATURES/CONFIDENTIALITY: You and/or your care partner have signed paperwork which will be entered into your electronic medical record.  These signatures attest to the fact that that  the information above on your After Visit Summary has been reviewed and is understood.  Full responsibility of the confidentiality of this discharge information lies with you and/or your care-partner.

## 2023-08-11 NOTE — Progress Notes (Signed)
 Pt sedate, gd SR's, VSS, report to RN

## 2023-08-12 ENCOUNTER — Telehealth: Payer: Self-pay

## 2023-08-12 NOTE — Telephone Encounter (Signed)
  Follow up Call-     08/11/2023    8:53 AM  Call back number  Post procedure Call Back phone  # [phone]  Permission to leave phone message Yes     Patient questions:  Do you have a fever, pain , or abdominal swelling? No. Pain Score  0 *  Have you tolerated food without any problems? Yes.    Have you been able to return to your normal activities? Yes.    Do you have any questions about your discharge instructions: Diet   No. Medications  No. Follow up visit  No.  Do you have questions or concerns about your Care? No.  Actions: * If pain score is 4 or above: No action needed, pain <4.

## 2023-08-13 LAB — SURGICAL PATHOLOGY

## 2023-08-16 ENCOUNTER — Encounter: Payer: Self-pay | Admitting: Internal Medicine

## 2023-08-16 DIAGNOSIS — Z860101 Personal history of adenomatous and serrated colon polyps: Secondary | ICD-10-CM | POA: Insufficient documentation

## 2023-08-16 HISTORY — DX: Personal history of adenomatous and serrated colon polyps: Z86.0101

## 2023-09-28 ENCOUNTER — Ambulatory Visit (INDEPENDENT_AMBULATORY_CARE_PROVIDER_SITE_OTHER): Admitting: Family Medicine

## 2023-09-28 ENCOUNTER — Encounter: Payer: Self-pay | Admitting: Family Medicine

## 2023-09-28 VITALS — BP 134/86 | HR 61 | Temp 97.7°F | Ht 67.0 in | Wt 213.0 lb

## 2023-09-28 DIAGNOSIS — R109 Unspecified abdominal pain: Secondary | ICD-10-CM

## 2023-09-28 DIAGNOSIS — M545 Low back pain, unspecified: Secondary | ICD-10-CM

## 2023-09-28 DIAGNOSIS — M79605 Pain in left leg: Secondary | ICD-10-CM | POA: Insufficient documentation

## 2023-09-28 DIAGNOSIS — G8929 Other chronic pain: Secondary | ICD-10-CM | POA: Insufficient documentation

## 2023-09-28 DIAGNOSIS — R10A1 Flank pain, right side: Secondary | ICD-10-CM | POA: Insufficient documentation

## 2023-09-28 LAB — POC URINALSYSI DIPSTICK (AUTOMATED)
Bilirubin, UA: NEGATIVE
Blood, UA: NEGATIVE
Glucose, UA: NEGATIVE
Ketones, UA: NEGATIVE
Leukocytes, UA: NEGATIVE
Nitrite, UA: NEGATIVE
Protein, UA: NEGATIVE
Spec Grav, UA: 1.015 (ref 1.010–1.025)
Urobilinogen, UA: 0.2 U/dL
pH, UA: 6 (ref 5.0–8.0)

## 2023-09-28 MED ORDER — METHOCARBAMOL 500 MG PO TABS: 500.0000 mg | ORAL_TABLET | Freq: Two times a day (BID) | ORAL | 0 refills | Status: DC | PRN

## 2023-09-28 NOTE — Assessment & Plan Note (Signed)
 Not consistent with PAD, CVI, DVT. Suspect possible shin splints - discussed this.

## 2023-09-28 NOTE — Patient Instructions (Signed)
 Possible muscle strain to right mid back.  Use heating pad to area, gentle stretching, try muscle relaxant sent to pharmacy (caution with sedation). I will refer you to physical therapy.  If no better with above, let me know to consider imaging study.

## 2023-09-28 NOTE — Assessment & Plan Note (Signed)
 Ongoing for 6+ months. UA today clear without hematuria or signs of infection.  Pain actually mid L flank region, no reproducible pain to palpation.  He does endorse positional discomfort - suspect MSK cause like lat dorsi strain.  Rx robaxin, discussed heating pad use, refer to PT.  Update if not improved with this. Low threshold to image (renal or abd US ) given longevity of symptoms No red flags

## 2023-09-28 NOTE — Progress Notes (Signed)
 Ph: [phone] Fax: [phone]   Patient ID: Randy Decker, male    DOB: 11/25/1955, 68 y.o.   MRN: [id number]  This visit was conducted in person.  BP 134/86   Pulse 61   Temp 97.7 F (36.5 C) (Oral)   Ht 5\' 7"  (1.702 m)   Wt 213 lb (96.6 kg)   SpO2 97%   BMI 33.36 kg/m    CC: R flank pain and L leg pain  Subjective:   HPI: Randy Decker is a 68 y.o. male presenting on 09/28/2023 for Back Pain (C/o R-side low back pain. Started mos ago- not improving. Also, c/o low L leg pain. )   5+ month h/o R mid back pain along flank and left lateral shin pain.  Positional reproducible pain to right flank.  Left shin pain seems worse in evenings. He does tend to bang shins a lot when working  Denies inciting trauma/injury or fall.  No numbness or weakness of leg, bowel/bladder incontinence, fevers/chills, saddle anesthesia.  No recent gout flares.  No h/o kidney stones H/o cholecystectomy for gall stones 04/2021.  No fever/chills, nausea, abd pain, dysuria or UTI symptoms.   No significant pain with sitting but notes pain when reaching to put on left sock.  Recent trip to [place] - noticed especially while riding on bumpy subway.  Notes chronic back issues for years from work.   Recent colonoscopy 07/2023 reviewed - diminutive TA, diverticulosis, rpt 7 yrs     Relevant past medical, surgical, family and social history reviewed and updated as indicated. Interim medical history since our last visit reviewed. Allergies and medications reviewed and updated. Outpatient Medications Prior to Visit  Medication Sig Dispense Refill   allopurinol  (ZYLOPRIM ) 100 MG tablet Take 1 tablet (100 mg total) by mouth daily. 90 tablet 4   amLODipine  (NORVASC ) 10 MG tablet Take 1 tablet (10 mg total) by mouth daily. 90 tablet 4   aspirin  EC 81 MG tablet Take 1 tablet (81 mg total) by mouth daily. Swallow whole. 90 tablet 3   colchicine  0.6 MG tablet Take 1 tablet (0.6 mg total) by mouth  daily as needed. May take 2 tablets on first day 30 tablet 3   doxycycline (VIBRAMYCIN) 100 MG capsule Take 100 mg by mouth daily.     lovastatin  (MEVACOR ) 40 MG tablet TAKE 1 TABLET BY MOUTH EVERYDAY AT BEDTIME 90 tablet 4   Multiple Vitamin (MULTIVITAMIN) capsule Take 1 capsule by mouth daily.     sildenafil  (VIAGRA ) 100 MG tablet Take 1 tablet (100 mg total) by mouth as needed for erectile dysfunction. 10 tablet 3   No facility-administered medications prior to visit.     Per HPI unless specifically indicated in ROS section below Review of Systems  Objective:  BP 134/86   Pulse 61   Temp 97.7 F (36.5 C) (Oral)   Ht 5\' 7"  (1.702 m)   Wt 213 lb (96.6 kg)   SpO2 97%   BMI 33.36 kg/m   Wt Readings from Last 3 Encounters:  09/28/23 213 lb (96.6 kg)  08/11/23 200 lb (90.7 kg)  07/22/23 200 lb (90.7 kg)      Physical Exam Vitals and nursing note reviewed.  Constitutional:      Appearance: Normal appearance. He is not ill-appearing.  HENT:     Mouth/Throat:     Mouth: Mucous membranes are moist.     Pharynx: Oropharynx is clear. No oropharyngeal exudate or posterior oropharyngeal  erythema.  Cardiovascular:     Rate and Rhythm: Normal rate and regular rhythm.     Pulses: Normal pulses.     Heart sounds: Normal heart sounds. No murmur heard. Pulmonary:     Effort: Pulmonary effort is normal. No respiratory distress.     Breath sounds: Normal breath sounds. No wheezing, rhonchi or rales.  Abdominal:     General: Bowel sounds are normal. There is no distension.     Palpations: Abdomen is soft. There is no mass.     Tenderness: There is no abdominal tenderness. There is no right CVA tenderness, left CVA tenderness, guarding or rebound.     Hernia: No hernia is present.  Musculoskeletal:        General: No tenderness.     Right lower leg: No edema.     Left lower leg: No edema.     Comments:  No pain midline spine No paraspinous mm tenderness Neg SLR bilaterally. No pain  with int/ext rotation at hip - but with marked stiffness with int rotation noted.  No reproducible pain to palpation of bilat lower legs No calf tenderness, no palpable cords   Skin:    General: Skin is warm and dry.     Findings: No rash.  Neurological:     Mental Status: He is alert.     Sensory: Sensation is intact.     Motor: Motor function is intact.     Coordination: Coordination is intact.     Gait: Gait is intact.     Comments:  CN grossly intact 5/5 strength BLE  Psychiatric:        Mood and Affect: Mood normal.        Behavior: Behavior normal.       Results for orders placed or performed in visit on 09/28/23  POCT Urinalysis Dipstick (Automated)   Collection Time: 09/28/23 10:00 AM  Result Value Ref Range   Color, UA yellow    Clarity, UA clear    Glucose, UA Negative Negative   Bilirubin, UA negative    Ketones, UA negative    Spec Grav, UA 1.015 1.010 - 1.025   Blood, UA negative    pH, UA 6.0 5.0 - 8.0   Protein, UA Negative Negative   Urobilinogen, UA 0.2 0.2 or 1.0 E.U./dL   Nitrite, UA negative    Leukocytes, UA Negative Negative   Lab Results  Component Value Date   NA 139 05/12/2023   CL 102 05/12/2023   K 4.3 05/12/2023   CO2 28 05/12/2023   BUN 12 05/12/2023   CREATININE 0.91 05/12/2023   GFR 86.78 05/12/2023   CALCIUM  9.3 05/12/2023   ALBUMIN 4.6 05/12/2023   GLUCOSE 89 05/12/2023    Lab Results  Component Value Date   ALT 18 05/12/2023   AST 21 05/12/2023   ALKPHOS 45 05/12/2023   BILITOT 0.8 05/12/2023    Lab Results  Component Value Date   WBC 5.7 03/06/2021   HGB 14.2 03/06/2021   HCT 42.4 03/06/2021   MCV 90.6 03/06/2021   PLT 270.0 03/06/2021    Assessment & Plan:   Problem List Items Addressed This Visit     Left leg pain   Not consistent with PAD, CVI, DVT. Suspect possible shin splints - discussed this.       Chronic right flank pain - Primary   Ongoing for 6+ months. UA today clear without hematuria or signs  of infection.  Pain actually mid  L flank region, no reproducible pain to palpation.  He does endorse positional discomfort - suspect MSK cause like lat dorsi strain.  Rx robaxin, discussed heating pad use, refer to PT.  Update if not improved with this. Low threshold to image (renal or abd US ) given longevity of symptoms No red flags      Relevant Medications   methocarbamol (ROBAXIN) 500 MG tablet   Other Relevant Orders   POCT Urinalysis Dipstick (Automated) (Completed)   Ambulatory referral to Physical Therapy     Meds ordered this encounter  Medications   methocarbamol (ROBAXIN) 500 MG tablet    Sig: Take 1-2 tablets (500-1,000 mg total) by mouth 2 (two) times daily as needed for muscle spasms (sedation precautions).    Dispense:  30 tablet    Refill:  0    Orders Placed This Encounter  Procedures   Ambulatory referral to Physical Therapy    Referral Priority:   Routine    Referral Type:   Physical Medicine    Referral Reason:   Specialty Services Required    Requested Specialty:   Physical Therapy    Number of Visits Requested:   1   POCT Urinalysis Dipstick (Automated)    Patient Instructions  Possible muscle strain to right mid back.  Use heating pad to area, gentle stretching, try muscle relaxant sent to pharmacy (caution with sedation). I will refer you to physical therapy.  If no better with above, let me know to consider imaging study.   Follow up plan: Return if symptoms worsen or fail to improve.  Claire Crick, MD

## 2023-11-02 NOTE — Therapy (Signed)
 OUTPATIENT PHYSICAL THERAPY EVALUATION   Patient Name: Randy Decker MRN: [id number] DOB:01/24/1956, 68 y.o., male Today's Date: 11/03/2023  END OF SESSION:  PT End of Session - 11/03/23 0825     Visit Number 1    Number of Visits 10    Date for PT Re-Evaluation 01/12/24    Authorization Type [employer] $10 copay    Progress Note Due on Visit 10    PT Start Time 0845    PT Stop Time 0918    PT Time Calculation (min) 33 min    Activity Tolerance Patient tolerated treatment well    Behavior During Therapy [facility] for tasks assessed/performed          Past Medical History:  Diagnosis Date   Essential hypertension 02/27/2015   Gout attack    H/O measles    possible--can't remember   Hemorrhoids    History of chicken pox    HLD (hyperlipidemia)    Hx of adenomatous polyp of colon 08/16/2023   Diminutive adenoma recall 2032    Obesity, Class I, BMI 30-34.9 02/27/2015   Past Surgical History:  Procedure Laterality Date   CHOLECYSTECTOMY N/A 05/10/2021   Procedure: LAPAROSCOPIC CHOLECYSTECTOMY;  Surgeon: Lyndel Deward PARAS, MD;  Location: [facility];  Service: General;  Laterality: N/A;   COLONOSCOPY  04/21/2013   WNL Ollen)   COLONOSCOPY  07/2023   TA, diverticulosis, rpt 7 yrs ([facility])   TONSILLECTOMY AND ADENOIDECTOMY  04/21/1962   UMBILICAL HERNIA REPAIR  04/22/2011   Patient Active Problem List   Diagnosis Date Noted   Left leg pain 09/28/2023   Chronic right flank pain 09/28/2023   Hx of adenomatous polyp of colon 08/16/2023   Family history of premature CAD 05/15/2022   Skin lesion of scalp 12/02/2021   Skin rash 12/02/2021   Dilated aortic root (HCC) 05/07/2021   Coronary artery calcification 05/07/2021   Facial rash 03/06/2021   Medicare annual wellness visit, subsequent 05/02/2020   Advanced directives, counseling/discussion 05/02/2020   Abnormal EKG 05/02/2020   Cholelithiasis 10/05/2019   Gout 09/22/2017   Erectile dysfunction  04/11/2015   Essential hypertension 02/27/2015   Obesity, Class I, BMI 30-34.9 02/27/2015   HLD (hyperlipidemia)    External hemorrhoids 06/15/2014   Family history of abdominal aortic aneurysm (AAA) 06/15/2014   Health maintenance examination 03/31/2013    PCP: Rilla Baller, MD  REFERRING PROVIDER: Rilla Baller, MD  REFERRING DIAG: Diagnosis R10.9,G89.29 (ICD-10-CM) - Chronic right flank pain  Rationale for Evaluation and Treatment: Rehabilitation  THERAPY DIAG:  Pain in thoracic spine  Other low back pain  ONSET DATE: Several months.   SUBJECTIVE:  SUBJECTIVE STATEMENT: Pt indicated various aches and pains in various body parts over the years.  Pt indicated having insidious onset of mid back Rt pain.  Pt indicated saw primary MD with cleared lab testing.  Pt indicated being referred for therapy from that point.  Pt indicated not sure what has caused this pain complaint.   Pt indicated bouncing on the lawn mower can hurt, sometimes reaching can hurt.  Sometimes pressure can hurt.  Pt indicated no sleeping difficulty.    PERTINENT HISTORY:  No specific   PAIN:  NPRS scale: at worst 4-6/10 Pain location: quick grab take your breath away Pain description: Rt mid back Aggravating factors: Stretching across body, bouncing activity (lawn mower) Relieving factors: stop doing what hurt.   PRECAUTIONS: None  WEIGHT BEARING RESTRICTIONS: No  FALLS:  Has patient fallen in last 6 months? No  LIVING ENVIRONMENT: Lives in: House/apartment 1 step to enter.    OCCUPATION: Retired.   PLOF: Independent, yardwork/mowing  PATIENT GOALS: Reduce pain   OBJECTIVE:   PATIENT SURVEYS:  Patient-Specific Activity Scoring Scheme  0 represents "unable to perform." 10 represents "able  to perform at prior level. 0 1 2 3 4 5 6 7 8 9  10 (Date and Score)   Activity Eval  11/03/2023    1. Putting on Lt sock/shoe 2     2. Mowing/yard work 9     3.     4.    5.    Score 5.5    Total score = sum of the activity scores/number of activities Minimum detectable change (90%CI) for average score = 2 points Minimum detectable change (90%CI) for single activity score = 3 points  SCREENING FOR RED FLAGS: 11/03/2023 Bowel or bladder incontinence: No Cauda equina syndrome: No  COGNITION: 11/03/2023 Overall cognitive status: WFL normal      SENSATION: 11/03/2023 [facility]  MUSCLE LENGTH: 11/03/2023 No specific testing.   POSTURE:  11/03/2023 Mild reduced lumbar lordosis  PALPATION: 11/03/2023 No specific tenderness noted.   LUMBAR ROM:  11/03/2023 Directional Preference Assessment: Centralization: not observed   Peripheralization: not observed    AROM Eval 11/03/2023  Lumbar Flexion To mid shin, no back pain noted.   Lumbar  Extension 75% WFL with tightness  Repeated x 5 in standing improved symptoms.    Lumbar  Right lateral flexion To knee joint with tightness Rt lumbar  Lumbar  Left lateral flexion To knee joint with tightness Rt lumbar  Lumbar  Right rotation   Lumbar Left rotation      Thoracic extension   Thoracic flexion   Thoracic Lt rotation Tightness noted in Rt back  Thoracic Rt rotation  Tightness noted in Rt back    (Blank rows = not tested)  UPPER EXTREMITY ROM:  ROM Right eval Left eval  Shoulder flexion    Shoulder extension    Shoulder abduction    Shoulder adduction    Shoulder extension    Shoulder internal rotation    Shoulder external rotation    Elbow flexion    Elbow extension    Wrist flexion    Wrist extension    Wrist ulnar deviation    Wrist radial deviation    Wrist pronation    Wrist supination     (Blank rows = not tested)   UPPER EXTREMITY MMT:  MMT Right eval Left eval  Shoulder flexion    Shoulder  extension    Shoulder abduction    Shoulder adduction  Shoulder extension    Shoulder internal rotation    Shoulder external rotation    Middle trapezius    Lower trapezius    Elbow flexion    Elbow extension    Wrist flexion    Wrist extension    Wrist ulnar deviation    Wrist radial deviation    Wrist pronation    Wrist supination    Grip strength     (Blank rows = not tested)   SPECIAL TESTS:  11/03/2023 No specific testing.   FUNCTIONAL TESTS:  11/03/2023 No specific testing  GAIT: 11/03/2023 Unremarkable                                                                                                                                                                                                                    TODAY'S TREATMENT:                                                                                                         DATE:  11/03/2023  Therex:    HEP instruction/performance c cues for techniques, handout provided.  Trial set performed of each for comprehension and symptom assessment.  See below for exercise list  TherActivity Verbal review of log rolling technique for sit to supine to sit transfers to limit lower back strain.     PATIENT EDUCATION:  Education details: HEP, POC Person educated: Patient Education method: Programmer, multimedia, Demonstration, Verbal cues, and Handouts Education comprehension: verbalized understanding, returned demonstration, and verbal cues required  HOME EXERCISE PROGRAM: Access Code: [email or web address] URL: [email or web address] Date: 11/03/2023 Prepared by: Ozell Silvan  Exercises - Supine Lower Trunk Rotation  - 2-3 x daily - 7 x weekly - 1 sets - 3-5 reps - 15 hold - Standing Lumbar Extension with Counter  - 2-3 x daily - 7 x weekly - 1 sets - 5-10 reps - Hooklying Single Knee to Chest Stretch  - 2-3 x daily - 7 x weekly - 1 sets - 5 reps - 15 hold - Supine Bridge  - 1-2 x daily - 7  x weekly - 1-2 sets - 10  reps - 2 hold  ASSESSMENT:  CLINICAL IMPRESSION: Patient is a 68 y.o. who comes to clinic with complaints of Rt pain with mobility, strength and movement coordination deficits that impair their ability to perform usual daily and recreational functional activities without increase difficulty/symptoms at this time.  Patient to benefit from skilled PT services to address impairments and limitations to improve to previous level of function without restriction secondary to condition.   OBJECTIVE IMPAIRMENTS: decreased activity tolerance, decreased mobility, decreased ROM, increased fascial restrictions, impaired perceived functional ability, increased muscle spasms, impaired flexibility, impaired UE functional use, improper body mechanics, and pain.   ACTIVITY LIMITATIONS: carrying, lifting, bending, bed mobility, and dressing  PARTICIPATION LIMITATIONS: interpersonal relationship and yard work  PERSONAL FACTORS: no specific factors are also affecting patient's functional outcome.   REHAB POTENTIAL: Good  CLINICAL DECISION MAKING: Stable/uncomplicated  EVALUATION COMPLEXITY: Low   GOALS: Goals reviewed with patient? Yes  SHORT TERM GOALS: (target date for Short term goals are 3 weeks 11/23/2023)  1. Patient will demonstrate independent use of home exercise program to maintain progress from in clinic treatments.  Goal status: New  LONG TERM GOALS: (target dates for all long term goals are 10 weeks  01/11/2024 )   1. Patient will demonstrate/report pain at worst less than or equal to 2/10 to facilitate minimal limitation in daily activity secondary to pain symptoms.  Goal status: New   2. Patient will demonstrate independent use of home exercise program to facilitate ability to maintain/progress functional gains from skilled physical therapy services.  Goal status: New   3. Patient will demonstrate Patient specific functional scale avg > or = 8/10 to indicate reduced disability due to  condition.   Goal status: New   4. Patient will demonstrate lumbar extension 100 % WFL s symptoms to facilitate upright standing, walking posture at PLOF s limitation.  Goal status: New   5.  Patient will demonstrate lateral flexion lumbar to knee without complaints to improve mobility for daily life.   Goal status: New   6.  Patient will demonstrate/report ability to put on sock/shoe without complaints from back.  Goal status: New   PLAN:  PT FREQUENCY: up to 1x/week  PT DURATION: 10 weeks  PLANNED INTERVENTIONS: Can include 02853- PT Re-evaluation, 97110-Therapeutic exercises, 97530- Therapeutic activity, [id number]- Neuromuscular re-education, 97535- Self Care, 97140- Manual therapy, [phone]- Gait training, [phone]- Orthotic Fit/training, [phone]- Canalith repositioning, [id number]- Aquatic Therapy, [phone]- Electrical stimulation (unattended), [id number] Physical performance testing, 97016- Vasopneumatic device, [id number]- Ultrasound, [id number]- Traction (mechanical), [id number]- Ionotophoresis 4mg /ml Dexamethasone ,  79439 - Needle insertion w/o injection 1 or 2 muscles, 20561 - Needle insertion w/o injection 3 or more muscles.    Patient/Family education, Balance training, Stair training, Taping, Dry Needling, Joint mobilization, Joint manipulation, Spinal manipulation, Spinal mobilization, Scar mobilization, Vestibular training, Visual/preceptual remediation/compensation, DME instructions, Cryotherapy, and Moist heat.  All performed as medically necessary.  All included unless contraindicated  PLAN FOR NEXT SESSION: Review HEP knowledge/results.  Check lumbar mobility.    Ozell Silvan, PT, DPT, OCS, ATC 11/03/23  9:31 AM

## 2023-11-03 ENCOUNTER — Encounter: Payer: Self-pay | Admitting: Rehabilitative and Restorative Service Providers"

## 2023-11-03 ENCOUNTER — Ambulatory Visit: Admitting: Rehabilitative and Restorative Service Providers"

## 2023-11-03 DIAGNOSIS — M5459 Other low back pain: Secondary | ICD-10-CM

## 2023-11-03 DIAGNOSIS — M546 Pain in thoracic spine: Secondary | ICD-10-CM | POA: Diagnosis not present

## 2023-11-24 ENCOUNTER — Encounter: Admitting: Rehabilitative and Restorative Service Providers"

## 2023-12-08 ENCOUNTER — Encounter: Admitting: Rehabilitative and Restorative Service Providers"

## 2024-02-01 ENCOUNTER — Other Ambulatory Visit: Payer: Self-pay | Admitting: Family Medicine

## 2024-02-01 DIAGNOSIS — N529 Male erectile dysfunction, unspecified: Secondary | ICD-10-CM

## 2024-05-02 ENCOUNTER — Ambulatory Visit

## 2024-05-02 VITALS — Ht 67.0 in | Wt 213.0 lb

## 2024-05-02 DIAGNOSIS — Z0000 Encounter for general adult medical examination without abnormal findings: Secondary | ICD-10-CM

## 2024-05-02 NOTE — Progress Notes (Signed)
 "  Chief Complaint  Patient presents with   Medicare Wellness     Subjective:   Randy Decker is a 69 y.o. male who presents for a Medicare Annual Wellness Visit.  Visit info / Clinical Intake: Medicare Wellness Visit Type:: Subsequent Annual Wellness Visit Persons participating in visit and providing information:: patient Medicare Wellness Visit Mode:: Telephone If telephone:: video declined Since this visit was completed virtually, some vitals may be partially provided or unavailable. Missing vitals are due to the limitations of the virtual format.: Documented vitals are patient reported If Telephone or Video please confirm:: I connected with patient using audio/video enable telemedicine. I verified patient identity with two identifiers, discussed telehealth limitations, and patient agreed to proceed. Patient Location:: Home Provider Location:: Office Interpreter Needed?: No Pre-visit prep was completed: yes AWV questionnaire completed by patient prior to visit?: yes Date:: 04/28/24 (pt also redid on 1/12 morning) Living arrangements:: (!) lives alone Patient's Overall Health Status Rating: good Typical amount of pain: none Does pain affect daily life?: no Are you currently prescribed opioids?: no  Dietary Habits and Nutritional Risks How many meals a day?: (!) 1 (1-2) Eats fruit and vegetables daily?: yes Most meals are obtained by: preparing own meals; eating out In the last 2 weeks, have you had any of the following?: none Diabetic:: no  Functional Status Activities of Daily Living (to include ambulation/medication): Independent Ambulation: Independent with device- listed below Home Assistive Devices/Equipment: Eyeglasses Medication Administration: Independent Home Management (perform basic housework or laundry): Independent Manage your own finances?: yes Primary transportation is: driving Concerns about vision?: no *vision screening is required for WTM* Concerns  about hearing?: no  Fall Screening Falls in the past year?: 0 Number of falls in past year: 0 Was there an injury with Fall?: 0 Fall Risk Category Calculator: 0 Patient Fall Risk Level: Low Fall Risk  Fall Risk Patient at Risk for Falls Due to: No Fall Risks Fall risk Follow up: Falls evaluation completed; Falls prevention discussed  Home and Transportation Safety: All rugs have non-skid backing?: N/A, no rugs All stairs or steps have railings?: N/A, no stairs Grab bars in the bathtub or shower?: (!) no Have non-skid surface in bathtub or shower?: yes Good home lighting?: yes Regular seat belt use?: yes Hospital stays in the last year:: no  Cognitive Assessment Difficulty concentrating, remembering, or making decisions? : no Will 6CIT or Mini Cog be Completed: yes What year is it?: 0 points What month is it?: 0 points Give patient an address phrase to remember (5 components): [address] [place] About what time is it?: 0 points Count backwards from 20 to 1: 0 points Say the months of the year in reverse: 0 points Repeat the address phrase from earlier: 0 points 6 CIT Score: 0 points  Advance Directives (For Healthcare) Does Patient Have a Medical Advance Directive?: No Would patient like information on creating a medical advance directive?: No - Patient declined  Reviewed/Updated  Reviewed/Updated: Reviewed All (Medical, Surgical, Family, Medications, Allergies, Care Teams, Patient Goals)    Allergies (verified) Patient has no known allergies.   Current Medications (verified) Outpatient Encounter Medications as of 05/02/2024  Medication Sig   allopurinol  (ZYLOPRIM ) 100 MG tablet Take 1 tablet (100 mg total) by mouth daily.   amLODipine  (NORVASC ) 10 MG tablet Take 1 tablet (10 mg total) by mouth daily.   aspirin  EC 81 MG tablet Take 1 tablet (81 mg total) by mouth daily. Swallow whole.   colchicine   0.6 MG tablet Take 1 tablet (0.6 mg total) by mouth daily as  needed. May take 2 tablets on first day   lovastatin  (MEVACOR ) 40 MG tablet TAKE 1 TABLET BY MOUTH EVERYDAY AT BEDTIME   methocarbamol  (ROBAXIN ) 500 MG tablet Take 1-2 tablets (500-1,000 mg total) by mouth 2 (two) times daily as needed for muscle spasms (sedation precautions).   Multiple Vitamin (MULTIVITAMIN) capsule Take 1 capsule by mouth daily.   sildenafil  (VIAGRA ) 100 MG tablet TAKE 1 TABLET BY MOUTH EVERY DAY AS NEEDED FOR ERECTILE DYSFUNCTION   doxycycline (VIBRAMYCIN) 100 MG capsule Take 100 mg by mouth daily. (Patient not taking: Reported on 05/02/2024)   No facility-administered encounter medications on file as of 05/02/2024.    History: Past Medical History:  Diagnosis Date   Essential hypertension 02/27/2015   Gout attack    H/O measles    possible--can't remember   Hemorrhoids    History of chicken pox    HLD (hyperlipidemia)    Hx of adenomatous polyp of colon 08/16/2023   Diminutive adenoma recall 2032    Obesity, Class I, BMI 30-34.9 02/27/2015   Past Surgical History:  Procedure Laterality Date   CHOLECYSTECTOMY N/A 05/10/2021   Procedure: LAPAROSCOPIC CHOLECYSTECTOMY;  Surgeon: Stechschulte, Deward PARAS, MD;  Location: [facility];  Service: General;  Laterality: N/A;   COLONOSCOPY  04/21/2013   WNL Ollen)   COLONOSCOPY  07/2023   TA, diverticulosis, rpt 7 yrs Ollen)   TONSILLECTOMY AND ADENOIDECTOMY  04/21/1962   UMBILICAL HERNIA REPAIR  04/22/2011   Family History  Problem Relation Age of Onset   Hypertension Father    Aortic aneurysm Father 80       deceased   Breast cancer Maternal Grandmother    Diabetes Neg Hx    Cancer Neg Hx    Stroke Neg Hx    Colon cancer Neg Hx    Colon polyps Neg Hx    Stomach cancer Neg Hx    Rectal cancer Neg Hx    Social History   Occupational History   Not on file  Tobacco Use   Smoking status: Former    Current packs/day: 0.00    Average packs/day: 1 pack/day for 5.0 years (5.0 ttl pk-yrs)     Types: Cigarettes    Start date: 04/21/1978    Quit date: 04/22/1983    Years since quitting: 41.0   Smokeless tobacco: Never   Tobacco comments:    Quit smoking 1985  Substance and Sexual Activity   Alcohol use: Not Currently    Comment: Occasional   Drug use: No   Sexual activity: Yes    Birth control/protection: None   Tobacco Counseling Counseling given: Yes Tobacco comments: Quit smoking 1985  SDOH Screenings   Food Insecurity: No Food Insecurity (05/02/2024)  Housing: Low Risk (05/02/2024)  Transportation Needs: No Transportation Needs (05/02/2024)  Utilities: Not At Risk (05/02/2024)  Alcohol Screen: Low Risk (05/02/2024)  Depression (PHQ2-9): Low Risk (05/02/2024)  Financial Resource Strain: Low Risk (05/02/2024)  Physical Activity: Unknown (05/02/2024)  Social Connections: Socially Isolated (05/02/2024)  Stress: No Stress Concern Present (05/02/2024)  Tobacco Use: Medium Risk (05/02/2024)  Health Literacy: Adequate Health Literacy (05/02/2024)   See flowsheets for full screening details  Depression Screen PHQ 2 & 9 Depression Scale- Over the past 2 weeks, how often have you been bothered by any of the following problems? Little interest or pleasure in doing things: 0 Feeling down, depressed, or hopeless (PHQ Adolescent also  includes...irritable): 0 PHQ-2 Total Score: 0 Trouble falling or staying asleep, or sleeping too much: 0 Feeling tired or having little energy: 0 Poor appetite or overeating (PHQ Adolescent also includes...weight loss): 0 Feeling bad about yourself - or that you are a failure or have let yourself or your family down: 0 Trouble concentrating on things, such as reading the newspaper or watching television (PHQ Adolescent also includes...like school work): 0 Moving or speaking so slowly that other people could have noticed. Or the opposite - being so fidgety or restless that you have been moving around a lot more than usual: 0 Thoughts that you would be better  off dead, or of hurting yourself in some way: 0 PHQ-9 Total Score: 0 If you checked off any problems, how difficult have these problems made it for you to do your work, take care of things at home, or get along with other people?: Not difficult at all  Depression Treatment Depression Interventions/Treatment : EYV7-0 Score <4 Follow-up Not Indicated     Goals Addressed               This Visit's Progress     Patient Stated (pt-stated)        Patient stated plans to continue walking and staying active             Objective:    Today's Vitals   05/02/24 1343  Weight: 213 lb (96.6 kg)  Height: 5' 7 (1.702 m)   Body mass index is 33.36 kg/m.  Hearing/Vision screen Hearing Screening - Comments:: Denies hearing difficulties   Vision Screening - Comments:: Wears eyeglasses for reading - up to date with routine eye exams with Norleen Agent Immunizations and Health Maintenance Health Maintenance  Topic Date Due   DTaP/Tdap/Td (2 - Td or Tdap) 04/21/2020   COVID-19 Vaccine (8 - 2025-26 season) 07/20/2024   Medicare Annual Wellness (AWV)  05/02/2025   Colonoscopy  08/11/2030   Pneumococcal Vaccine: 50+ Years  Completed   Influenza Vaccine  Completed   Hepatitis C Screening  Completed   Zoster Vaccines- Shingrix  Completed   Meningococcal B Vaccine  Aged Out   Hepatitis B Vaccines 19-59 Average Risk  Discontinued        Assessment/Plan:  This is a routine wellness examination for Randy Decker.  Patient Care Team: Rilla Baller, MD as PCP - General (Family Medicine) Loni Soyla LABOR, MD as PCP - Cardiology (Cardiology) Joshua Norleen, OD (Optometry) Avram Lupita BRAVO, MD as Consulting Physician (Gastroenterology)  I have personally reviewed and noted the following in the patients chart:   Medical and social history Use of alcohol, tobacco or illicit drugs  Current medications and supplements including opioid prescriptions. Functional ability and status Nutritional  status Physical activity Advanced directives List of other physicians Hospitalizations, surgeries, and ER visits in previous 12 months Vitals Screenings to include cognitive, depression, and falls Referrals and appointments  No orders of the defined types were placed in this encounter.  In addition, I have reviewed and discussed with patient certain preventive protocols, quality metrics, and best practice recommendations. A written personalized care plan for preventive services as well as general preventive health recommendations were provided to patient.   Randy Decker CHRISTELLA Saba, CMA   05/02/2024   Return in 1 year (on 05/02/2025).  After Visit Summary: (MyChart) Due to this being a telephonic visit, the after visit summary with patients personalized plan was offered to patient via MyChart   Nurse Notes: scheduled 2027 AWV appt "

## 2024-05-02 NOTE — Patient Instructions (Addendum)
 Randy Decker,  Thank you for taking the time for your Medicare Wellness Visit. I appreciate your continued commitment to your health goals. Please review the care plan we discussed, and feel free to reach out if I can assist you further.  Please note that Annual Wellness Visits do not include a physical exam. Some assessments may be limited, especially if the visit was conducted virtually. If needed, we may recommend an in-person follow-up with your provider.  Ongoing Care Seeing your primary care provider every 3 to 6 months helps us  monitor your health and provide consistent, personalized care.   Referrals If a referral was made during today's visit and you haven't received any updates within two weeks, please contact the referred provider directly to check on the status.  Recommended Screenings:  Health Maintenance  Topic Date Due   DTaP/Tdap/Td vaccine (2 - Td or Tdap) 04/21/2020   COVID-19 Vaccine (9 - 2025-26 season) 07/20/2024   Medicare Annual Wellness Visit  05/02/2025   Colon Cancer Screening  08/11/2030   Pneumococcal Vaccine for age over 71  Completed   Flu Shot  Completed   Hepatitis C Screening  Completed   Zoster (Shingles) Vaccine  Completed   Meningitis B Vaccine  Aged Out   Hepatitis B Vaccine  Discontinued       11/03/2023    8:25 AM  Advanced Directives  Does Patient Have a Medical Advance Directive? No  Would patient like information on creating a medical advance directive? No - Patient declined    Vision: Annual vision screenings are recommended for early detection of glaucoma, cataracts, and diabetic retinopathy. These exams can also reveal signs of chronic conditions such as diabetes and high blood pressure.  Dental: Annual dental screenings help detect early signs of oral cancer, gum disease, and other conditions linked to overall health, including heart disease and diabetes.

## 2024-05-13 ENCOUNTER — Ambulatory Visit: Payer: Self-pay | Admitting: Family Medicine

## 2024-05-13 ENCOUNTER — Ambulatory Visit: Payer: PPO | Admitting: Family Medicine

## 2024-05-13 ENCOUNTER — Encounter: Payer: Self-pay | Admitting: Family Medicine

## 2024-05-13 VITALS — BP 122/84 | HR 57 | Temp 98.8°F | Ht 67.0 in | Wt 205.6 lb

## 2024-05-13 DIAGNOSIS — E785 Hyperlipidemia, unspecified: Secondary | ICD-10-CM | POA: Diagnosis not present

## 2024-05-13 DIAGNOSIS — I10 Essential (primary) hypertension: Secondary | ICD-10-CM | POA: Diagnosis not present

## 2024-05-13 DIAGNOSIS — N529 Male erectile dysfunction, unspecified: Secondary | ICD-10-CM

## 2024-05-13 DIAGNOSIS — Z125 Encounter for screening for malignant neoplasm of prostate: Secondary | ICD-10-CM

## 2024-05-13 DIAGNOSIS — M1A00X0 Idiopathic chronic gout, unspecified site, without tophus (tophi): Secondary | ICD-10-CM | POA: Diagnosis not present

## 2024-05-13 DIAGNOSIS — G8929 Other chronic pain: Secondary | ICD-10-CM

## 2024-05-13 DIAGNOSIS — Z7189 Other specified counseling: Secondary | ICD-10-CM

## 2024-05-13 DIAGNOSIS — Z0000 Encounter for general adult medical examination without abnormal findings: Secondary | ICD-10-CM

## 2024-05-13 DIAGNOSIS — R10A1 Flank pain, right side: Secondary | ICD-10-CM

## 2024-05-13 DIAGNOSIS — M79605 Pain in left leg: Secondary | ICD-10-CM

## 2024-05-13 DIAGNOSIS — E66811 Obesity, class 1: Secondary | ICD-10-CM

## 2024-05-13 LAB — CBC WITH DIFFERENTIAL/PLATELET
Basophils Absolute: 0.1 K/uL (ref 0.0–0.1)
Basophils Relative: 1.1 % (ref 0.0–3.0)
Eosinophils Absolute: 0.1 K/uL (ref 0.0–0.7)
Eosinophils Relative: 3 % (ref 0.0–5.0)
HCT: 42.6 % (ref 39.0–52.0)
Hemoglobin: 14.4 g/dL (ref 13.0–17.0)
Lymphocytes Relative: 32.3 % (ref 12.0–46.0)
Lymphs Abs: 1.5 K/uL (ref 0.7–4.0)
MCHC: 33.7 g/dL (ref 30.0–36.0)
MCV: 91 fl (ref 78.0–100.0)
Monocytes Absolute: 0.5 K/uL (ref 0.1–1.0)
Monocytes Relative: 10.5 % (ref 3.0–12.0)
Neutro Abs: 2.5 K/uL (ref 1.4–7.7)
Neutrophils Relative %: 53.1 % (ref 43.0–77.0)
Platelets: 206 K/uL (ref 150.0–400.0)
RBC: 4.68 Mil/uL (ref 4.22–5.81)
RDW: 12.9 % (ref 11.5–15.5)
WBC: 4.7 K/uL (ref 4.0–10.5)

## 2024-05-13 LAB — COMPREHENSIVE METABOLIC PANEL WITH GFR
ALT: 14 U/L (ref 3–53)
AST: 20 U/L (ref 5–37)
Albumin: 4.3 g/dL (ref 3.5–5.2)
Alkaline Phosphatase: 40 U/L (ref 39–117)
BUN: 15 mg/dL (ref 6–23)
CO2: 29 meq/L (ref 19–32)
Calcium: 8.8 mg/dL (ref 8.4–10.5)
Chloride: 103 meq/L (ref 96–112)
Creatinine, Ser: 0.89 mg/dL (ref 0.40–1.50)
GFR: 87.62 mL/min
Glucose, Bld: 95 mg/dL (ref 70–99)
Potassium: 3.8 meq/L (ref 3.5–5.1)
Sodium: 138 meq/L (ref 135–145)
Total Bilirubin: 0.7 mg/dL (ref 0.2–1.2)
Total Protein: 6.7 g/dL (ref 6.0–8.3)

## 2024-05-13 LAB — LIPID PANEL
Cholesterol: 148 mg/dL (ref 28–200)
HDL: 54.4 mg/dL
LDL Cholesterol: 76 mg/dL (ref 10–99)
NonHDL: 94.03
Total CHOL/HDL Ratio: 3
Triglycerides: 92 mg/dL (ref 10.0–149.0)
VLDL: 18.4 mg/dL (ref 0.0–40.0)

## 2024-05-13 LAB — PSA: PSA: 2.25 ng/mL (ref 0.10–4.00)

## 2024-05-13 LAB — POC URINALSYSI DIPSTICK (AUTOMATED)
Bilirubin, UA: NEGATIVE
Blood, UA: NEGATIVE
Glucose, UA: NEGATIVE
Ketones, UA: NEGATIVE
Leukocytes, UA: NEGATIVE
Nitrite, UA: NEGATIVE
Protein, UA: NEGATIVE
Spec Grav, UA: 1.015
Urobilinogen, UA: 0.2 U/dL
pH, UA: 6

## 2024-05-13 LAB — URIC ACID: Uric Acid, Serum: 6.3 mg/dL (ref 4.0–7.8)

## 2024-05-13 MED ORDER — ALLOPURINOL 100 MG PO TABS: 100.0000 mg | ORAL_TABLET | Freq: Every day | ORAL | 3 refills | Status: AC

## 2024-05-13 MED ORDER — SILDENAFIL CITRATE 100 MG PO TABS: 50.0000 mg | ORAL_TABLET | ORAL | 3 refills | Status: AC | PRN

## 2024-05-13 MED ORDER — AMLODIPINE BESYLATE 10 MG PO TABS: 10.0000 mg | ORAL_TABLET | Freq: Every day | ORAL | 3 refills | Status: AC

## 2024-05-13 MED ORDER — LOVASTATIN 40 MG PO TABS: ORAL_TABLET | ORAL | 3 refills | Status: AC

## 2024-05-13 NOTE — Progress Notes (Signed)
 " Ph: [phone] Fax: [phone]   Patient ID: Randy Decker, male    DOB: 1956-02-21, 69 y.o.   MRN: [id number]  This visit was conducted in person.  BP 122/84 (BP Location: Right Arm, Patient Position: Sitting, Cuff Size: Normal)   Pulse (!) 57   Temp 98.8 F (37.1 C) (Oral)   Ht 5' 7 (1.702 m)   Wt 205 lb 9.6 oz (93.3 kg)   SpO2 98%   BMI 32.20 kg/m    CC: CPE Subjective:   HPI: Randy Decker is a 69 y.o. male presenting on 05/13/2024 for Annual Exam (No concerns)   Saw health advisor earlier this month for medicare wellness visit. Note reviewed.   No results found.  Flowsheet Row Office Visit from 05/13/2024 in [facility] [place]  PHQ-2 Total Score 0       05/13/2024    8:05 AM 05/02/2024    2:00 PM 05/02/2024    9:17 AM 04/28/2024   10:45 AM 04/29/2023    3:00 PM  Fall Risk   Falls in the past year? 0 0 0  0  0   Number falls in past yr: 0 0   0   Injury with Fall? 0 0   0    Risk for fall due to : No Fall Risks No Fall Risks   No Fall Risks  Follow up Falls evaluation completed Falls evaluation completed;Falls prevention discussed   Education provided;Falls prevention discussed     Manually entered by patient   Data saved with a previous flowsheet row definition    S/p lap cholecystectomy 04/2021 for symptomatic gallstones ([place]schulte). Incidentally noted 2cm parapelvic left kidney cyst.    Gout - on allopurinol  100mg  daily with significant improvement of flares. No recent colchicine  use.  ED - doing well on sildenafil  50mg  prn.   Chronic R flank pain thought MSK - Rx robaxin , heating pad, PT. Did 1 PT session - this helped some but hasn't fully resolved.   Off and on left lower leg pain to anterior tibia for years. No calf pain.    Fmhx AAA (father) - pt had normal vascular screenings [facility] 08/[facility].  Fmhx CAD - father MI age 85. Coronary calcium  score 80 (51%) (05/220). Saw cardiologist Dr Loni restarted on  aspirin  81mg  daily along with continued lovastatin  40mg  daily. Had borderline dilation of ascending aorta 38mm from echo 05/2021.    Preventative: Colonoscopy 07/2023- diminutive TA, diverticulosis, rpt 7 yrs Ollen) Prostate cancer screening - continue yearly screening, no BPH sxs  Lung cancer screening - not eligible  Flu shot - yearly  COVID Pfizer 06/2019 x2, 12/2019, 07/2020, bivalent booster 01/2021, 12/2021, 01/2024 Tdap 2012  Prevnar-13 2020, pneumovax 04/2022 RSV 01/2022 Hep B series 2012 zostavax 2016 shingrix - 06/2018, 11/2018  Advanced directives - discussed, no living will set up. Packet previously provided, again today. Oldest daughter would be HCPOA.  Seat belt use discussed.  Sunscreen use discussed. No changing moles on skin.  Ex smoker - quit remotely 1985  Alcohol - very little  Dentist - q6 mo Eye exam - due  Bowel - no constipation Bladder - no incontinence  Lives alone, 2 cats and 1 dog   Occupation: maintenance Psychologist, Clinical) - furloughed 2020, now retired Edu: Automotive Engineer  Activity: walking daily up to 2 hours in good weather, walking on elliptical or outside daily, cutting wood  Diet: good water, drinks sugar free powerade, some fruits/vegetables,  fish and chicken      Relevant past medical, surgical, family and social history reviewed and updated as indicated. Interim medical history since our last visit reviewed. Allergies and medications reviewed and updated. Outpatient Medications Prior to Visit  Medication Sig Dispense Refill   aspirin  EC 81 MG tablet Take 1 tablet (81 mg total) by mouth daily. Swallow whole. 90 tablet 3   colchicine  0.6 MG tablet Take 1 tablet (0.6 mg total) by mouth daily as needed. May take 2 tablets on first day 30 tablet 3   doxycycline (VIBRAMYCIN) 100 MG capsule Take 100 mg by mouth daily.     Multiple Vitamin (MULTIVITAMIN) capsule Take 1 capsule by mouth daily.     allopurinol  (ZYLOPRIM ) 100 MG tablet Take 1 tablet (100 mg  total) by mouth daily. 90 tablet 4   amLODipine  (NORVASC ) 10 MG tablet Take 1 tablet (10 mg total) by mouth daily. 90 tablet 4   lovastatin  (MEVACOR ) 40 MG tablet TAKE 1 TABLET BY MOUTH EVERYDAY AT BEDTIME 90 tablet 4   sildenafil  (VIAGRA ) 100 MG tablet TAKE 1 TABLET BY MOUTH EVERY DAY AS NEEDED FOR ERECTILE DYSFUNCTION 10 tablet 3   methocarbamol  (ROBAXIN ) 500 MG tablet Take 1-2 tablets (500-1,000 mg total) by mouth 2 (two) times daily as needed for muscle spasms (sedation precautions). (Patient not taking: Reported on 05/13/2024) 30 tablet 0   No facility-administered medications prior to visit.     Per HPI unless specifically indicated in ROS section below Review of Systems  Constitutional:  Negative for activity change, appetite change, chills, fatigue, fever and unexpected weight change.  HENT:  Negative for hearing loss.   Eyes:  Negative for visual disturbance.  Respiratory:  Negative for cough, chest tightness, shortness of breath and wheezing.   Cardiovascular:  Negative for chest pain, palpitations and leg swelling.  Gastrointestinal:  Negative for abdominal distention, abdominal pain, blood in stool, constipation, diarrhea, nausea and vomiting.  Genitourinary:  Positive for flank pain (R). Negative for difficulty urinating and hematuria.  Musculoskeletal:  Negative for arthralgias, myalgias and neck pain.  Skin:  Negative for rash.  Neurological:  Negative for dizziness, seizures, syncope and headaches.  Hematological:  Negative for adenopathy. Does not bruise/bleed easily.  Psychiatric/Behavioral:  Negative for dysphoric mood. The patient is not nervous/anxious.     Objective:  BP 122/84 (BP Location: Right Arm, Patient Position: Sitting, Cuff Size: Normal)   Pulse (!) 57   Temp 98.8 F (37.1 C) (Oral)   Ht 5' 7 (1.702 m)   Wt 205 lb 9.6 oz (93.3 kg)   SpO2 98%   BMI 32.20 kg/m   Wt Readings from Last 3 Encounters:  05/13/24 205 lb 9.6 oz (93.3 kg)  05/02/24 213 lb  (96.6 kg)  09/28/23 213 lb (96.6 kg)      Physical Exam Vitals and nursing note reviewed.  Constitutional:      General: He is not in acute distress.    Appearance: Normal appearance. He is well-developed. He is not ill-appearing.  HENT:     Head: Normocephalic and atraumatic.     Right Ear: Hearing, tympanic membrane, ear canal and external ear normal.     Left Ear: Hearing, tympanic membrane, ear canal and external ear normal.     Mouth/Throat:     Mouth: Mucous membranes are moist.     Pharynx: Oropharynx is clear. No oropharyngeal exudate or posterior oropharyngeal erythema.  Eyes:     General: No scleral icterus.  Extraocular Movements: Extraocular movements intact.     Conjunctiva/sclera: Conjunctivae normal.     Pupils: Pupils are equal, round, and reactive to light.  Neck:     Thyroid : No thyroid  mass or thyromegaly.  Cardiovascular:     Rate and Rhythm: Normal rate and regular rhythm.     Pulses: Normal pulses.          Radial pulses are 2+ on the right side and 2+ on the left side.     Heart sounds: Normal heart sounds. No murmur heard. Pulmonary:     Effort: Pulmonary effort is normal. No respiratory distress.     Breath sounds: Normal breath sounds. No wheezing, rhonchi or rales.  Abdominal:     General: Bowel sounds are normal. There is no distension.     Palpations: Abdomen is soft. There is no mass.     Tenderness: There is no abdominal tenderness. There is no guarding or rebound.     Hernia: No hernia is present.  Musculoskeletal:        General: Normal range of motion.     Cervical back: Normal range of motion and neck supple.     Right lower leg: No edema.     Left lower leg: No edema.  Lymphadenopathy:     Cervical: No cervical adenopathy.  Skin:    General: Skin is warm and dry.     Findings: No rash.  Neurological:     General: No focal deficit present.     Mental Status: He is alert and oriented to person, place, and time.  Psychiatric:         Mood and Affect: Mood normal.        Behavior: Behavior normal.        Thought Content: Thought content normal.        Judgment: Judgment normal.       Results for orders placed or performed in visit on 05/13/24  POCT Urinalysis Dipstick (Automated)   Collection Time: 05/13/24  8:38 AM  Result Value Ref Range   Color, UA yellow    Clarity, UA clear    Glucose, UA Negative Negative   Bilirubin, UA Negative    Ketones, UA Negative    Spec Grav, UA 1.015 1.010 - 1.025   Blood, UA Negative    pH, UA 6.0 5.0 - 8.0   Protein, UA Negative Negative   Urobilinogen, UA 0.2 0.2 or 1.0 E.U./dL   Nitrite, UA Negative    Leukocytes, UA Negative Negative    Assessment & Plan:   Problem List Items Addressed This Visit     Health maintenance examination - Primary (Chronic)   Preventative protocols reviewed and updated unless pt declined. Discussed healthy diet and lifestyle.       Advanced directives, counseling/discussion (Chronic)   Advanced directives - discussed, no living will set up. Packet previously provided. Oldest daughter would be HCPOA.       HLD (hyperlipidemia)   Chronic, stable on lovastatin  - continue. Update labs.  The 10-year ASCVD risk score (Arnett DK, et al., 2019) is: 16.5%   Values used to calculate the score:     Age: 36 years     Clinically relevant sex: Male     Is Non-Hispanic African American: No     Diabetic: No     Tobacco smoker: No     Systolic Blood Pressure: 122 mmHg     Is BP treated: Yes     HDL Cholesterol:  44.6 mg/dL     Total Cholesterol: 152 mg/dL       Relevant Medications   amLODipine  (NORVASC ) 10 MG tablet   lovastatin  (MEVACOR ) 40 MG tablet   sildenafil  (VIAGRA ) 100 MG tablet   Other Relevant Orders   Lipid panel   Comprehensive metabolic panel with GFR   Essential hypertension   Chronic, stable on amlodipine  - continue.       Relevant Medications   amLODipine  (NORVASC ) 10 MG tablet   lovastatin  (MEVACOR ) 40 MG tablet    sildenafil  (VIAGRA ) 100 MG tablet   Obesity, Class I, BMI 30-34.9   Continue to encourage healthy diet and lifestyle choices to affect sustainable weight loss.       Erectile dysfunction   Viagra  refilled       Relevant Medications   sildenafil  (VIAGRA ) 100 MG tablet   Gout   Chronic, stable period on allopurinol  100mg  daily - without recent gout flare. Continue current regimen. He has colchicine  to use PRN       Relevant Medications   allopurinol  (ZYLOPRIM ) 100 MG tablet   Other Relevant Orders   Uric acid   Anterior leg pain, left   Chronic, present for years.  Reassuring exam No bony point tenderness  ?soft tissue - suggested topical voltaren gel. Consider xray bone imaging if not improved with this.  No trauma, no injury, no red flags.       Chronic right flank pain   Ongoing, may have been present since cholecystectomy 04/2021. PT was somewhat beneficial (09/2023). ?post-op cholecystectomy -related discomfort.  Update UA, renal panel, CBC, and renal US .       Relevant Orders   CBC with Differential/Platelet   US  Renal   Other Visit Diagnoses       Special screening for malignant neoplasm of prostate       Relevant Orders   PSA     Right flank pain       Relevant Orders   POCT Urinalysis Dipstick (Automated) (Completed)        Meds ordered this encounter  Medications   allopurinol  (ZYLOPRIM ) 100 MG tablet    Sig: Take 1 tablet (100 mg total) by mouth daily.    Dispense:  90 tablet    Refill:  3   amLODipine  (NORVASC ) 10 MG tablet    Sig: Take 1 tablet (10 mg total) by mouth daily.    Dispense:  90 tablet    Refill:  3   lovastatin  (MEVACOR ) 40 MG tablet    Sig: TAKE 1 TABLET BY MOUTH EVERYDAY AT BEDTIME    Dispense:  90 tablet    Refill:  3   sildenafil  (VIAGRA ) 100 MG tablet    Sig: Take 0.5-1 tablets (50-100 mg total) by mouth as needed for erectile dysfunction.    Dispense:  10 tablet    Refill:  3    Orders Placed This Encounter   Procedures   US  Renal    Standing Status:   Future    Expiration Date:   05/13/2025    Reason for Exam (SYMPTOM  OR DIAGNOSIS REQUIRED):   chronic R flank pain, h/o L kidney cyst    Preferred imaging location?:   [facility]   Lipid panel   Comprehensive metabolic panel with GFR   Uric acid   PSA   CBC with Differential/Platelet   POCT Urinalysis Dipstick (Automated)    Patient Instructions  Labs today  Urinalysis today  I will order  kidney ultrasound in [place]. You may call them to schedule appointment [phone]  Good to see you today Return as needed or in 1 year for next physical  Follow up plan: Return in about 1 year (around 05/13/2025) for annual exam, prior fasting for blood work.  Anton Blas, MD   "

## 2024-05-13 NOTE — Assessment & Plan Note (Addendum)
 Viagra  refilled.

## 2024-05-13 NOTE — Assessment & Plan Note (Signed)
 Advanced directives - discussed, no living will set up. Packet previously provided. Oldest daughter would be HCPOA.

## 2024-05-13 NOTE — Assessment & Plan Note (Addendum)
 Ongoing, may have been present since cholecystectomy 04/2021. PT was somewhat beneficial (09/2023). ?post-op cholecystectomy -related discomfort.  Update UA, renal panel, CBC, and renal US .

## 2024-05-13 NOTE — Assessment & Plan Note (Signed)
 Chronic, stable period on allopurinol  100mg  daily - without recent gout flare. Continue current regimen. He has colchicine  to use PRN

## 2024-05-13 NOTE — Assessment & Plan Note (Signed)
Chronic, stable on amlodipine - continue.

## 2024-05-13 NOTE — Patient Instructions (Addendum)
 Labs today  Urinalysis today  I will order kidney ultrasound in [place]. You may call them to schedule appointment [phone]  Good to see you today Return as needed or in 1 year for next physical

## 2024-05-13 NOTE — Assessment & Plan Note (Addendum)
 Chronic, stable on lovastatin  - continue. Update labs.  The 10-year ASCVD risk score (Arnett DK, et al., 2019) is: 16.5%   Values used to calculate the score:     Age: 69 years     Clinically relevant sex: Male     Is Non-Hispanic African American: No     Diabetic: No     Tobacco smoker: No     Systolic Blood Pressure: 122 mmHg     Is BP treated: Yes     HDL Cholesterol: 44.6 mg/dL     Total Cholesterol: 152 mg/dL

## 2024-05-13 NOTE — Assessment & Plan Note (Signed)
 Continue to encourage healthy diet and lifestyle choices to affect sustainable weight loss.

## 2024-05-13 NOTE — Assessment & Plan Note (Addendum)
 Chronic, present for years.  Reassuring exam No bony point tenderness  ?soft tissue - suggested topical voltaren gel. Consider xray bone imaging if not improved with this.  No trauma, no injury, no red flags.

## 2024-05-13 NOTE — Assessment & Plan Note (Signed)
 Preventative protocols reviewed and updated unless pt declined. Discussed healthy diet and lifestyle.

## 2024-05-23 ENCOUNTER — Other Ambulatory Visit

## 2024-05-26 ENCOUNTER — Inpatient Hospital Stay: Admission: RE | Admit: 2024-05-26 | Discharge: 2024-05-26 | Attending: Family Medicine | Admitting: Family Medicine

## 2024-05-26 DIAGNOSIS — R10A1 Flank pain, right side: Secondary | ICD-10-CM

## 2025-05-04 ENCOUNTER — Ambulatory Visit

## 2025-05-17 ENCOUNTER — Encounter: Admitting: Family Medicine
